# Patient Record
Sex: Female | Born: 1935 | Race: White | Hispanic: No | Smoking: Former smoker
Health system: Southern US, Community
[De-identification: ages and names within clinical notes are randomized; demographics above are authoritative.]

## PROBLEM LIST (undated history)

## (undated) DIAGNOSIS — C801 Malignant (primary) neoplasm, unspecified: Secondary | ICD-10-CM

## (undated) DIAGNOSIS — L920 Granuloma annulare: Secondary | ICD-10-CM

## (undated) DIAGNOSIS — J449 Chronic obstructive pulmonary disease, unspecified: Secondary | ICD-10-CM

## (undated) DIAGNOSIS — R269 Unspecified abnormalities of gait and mobility: Principal | ICD-10-CM

## (undated) DIAGNOSIS — E7800 Pure hypercholesterolemia, unspecified: Secondary | ICD-10-CM

## (undated) DIAGNOSIS — T8859XA Other complications of anesthesia, initial encounter: Secondary | ICD-10-CM

## (undated) DIAGNOSIS — T4145XA Adverse effect of unspecified anesthetic, initial encounter: Secondary | ICD-10-CM

## (undated) DIAGNOSIS — K5790 Diverticulosis of intestine, part unspecified, without perforation or abscess without bleeding: Secondary | ICD-10-CM

## (undated) DIAGNOSIS — E039 Hypothyroidism, unspecified: Secondary | ICD-10-CM

## (undated) DIAGNOSIS — G5620 Lesion of ulnar nerve, unspecified upper limb: Secondary | ICD-10-CM

## (undated) DIAGNOSIS — F419 Anxiety disorder, unspecified: Secondary | ICD-10-CM

## (undated) DIAGNOSIS — I341 Nonrheumatic mitral (valve) prolapse: Secondary | ICD-10-CM

## (undated) DIAGNOSIS — I10 Essential (primary) hypertension: Secondary | ICD-10-CM

## (undated) DIAGNOSIS — I509 Heart failure, unspecified: Secondary | ICD-10-CM

## (undated) HISTORY — PX: TONSILLECTOMY: SUR1361

## (undated) HISTORY — DX: Granuloma annulare: L92.0

## (undated) HISTORY — DX: Unspecified abnormalities of gait and mobility: R26.9

## (undated) HISTORY — DX: Anxiety disorder, unspecified: F41.9

## (undated) HISTORY — PX: APPENDECTOMY: SHX54

## (undated) HISTORY — DX: Pure hypercholesterolemia, unspecified: E78.00

## (undated) HISTORY — PX: CHOLECYSTECTOMY: SHX55

## (undated) HISTORY — PX: MASTECTOMY: SHX3

## (undated) HISTORY — PX: ULNAR NERVE REPAIR: SHX2594

---

## 1998-12-11 ENCOUNTER — Encounter: Payer: Self-pay | Admitting: Emergency Medicine

## 1998-12-12 ENCOUNTER — Inpatient Hospital Stay (HOSPITAL_COMMUNITY): Admission: EM | Admit: 1998-12-12 | Discharge: 1998-12-17 | Payer: Self-pay | Admitting: Emergency Medicine

## 1998-12-12 ENCOUNTER — Encounter: Payer: Self-pay | Admitting: Internal Medicine

## 1998-12-12 ENCOUNTER — Encounter: Payer: Self-pay | Admitting: Emergency Medicine

## 1998-12-14 ENCOUNTER — Encounter: Payer: Self-pay | Admitting: Internal Medicine

## 1998-12-25 ENCOUNTER — Encounter: Admission: RE | Admit: 1998-12-25 | Discharge: 1999-03-25 | Payer: Self-pay | Admitting: Internal Medicine

## 2000-08-26 ENCOUNTER — Encounter: Payer: Self-pay | Admitting: Internal Medicine

## 2000-08-26 ENCOUNTER — Encounter: Admission: RE | Admit: 2000-08-26 | Discharge: 2000-08-26 | Payer: Self-pay | Admitting: Internal Medicine

## 2001-07-22 ENCOUNTER — Encounter: Admission: RE | Admit: 2001-07-22 | Discharge: 2001-07-22 | Payer: Self-pay | Admitting: Internal Medicine

## 2001-07-22 ENCOUNTER — Encounter: Payer: Self-pay | Admitting: Internal Medicine

## 2003-11-04 DIAGNOSIS — K5790 Diverticulosis of intestine, part unspecified, without perforation or abscess without bleeding: Secondary | ICD-10-CM

## 2003-11-04 HISTORY — DX: Diverticulosis of intestine, part unspecified, without perforation or abscess without bleeding: K57.90

## 2003-11-22 ENCOUNTER — Ambulatory Visit (HOSPITAL_COMMUNITY): Admission: RE | Admit: 2003-11-22 | Discharge: 2003-11-22 | Payer: Self-pay | Admitting: Gastroenterology

## 2004-02-21 ENCOUNTER — Encounter: Admission: RE | Admit: 2004-02-21 | Discharge: 2004-02-21 | Payer: Self-pay | Admitting: Internal Medicine

## 2004-04-15 ENCOUNTER — Encounter: Admission: RE | Admit: 2004-04-15 | Discharge: 2004-04-15 | Payer: Self-pay | Admitting: Otolaryngology

## 2005-07-14 ENCOUNTER — Encounter: Admission: RE | Admit: 2005-07-14 | Discharge: 2005-07-14 | Payer: Self-pay | Admitting: Internal Medicine

## 2007-11-09 ENCOUNTER — Encounter: Admission: RE | Admit: 2007-11-09 | Discharge: 2007-11-09 | Payer: Self-pay | Admitting: Internal Medicine

## 2008-12-12 ENCOUNTER — Ambulatory Visit (HOSPITAL_BASED_OUTPATIENT_CLINIC_OR_DEPARTMENT_OTHER): Admission: RE | Admit: 2008-12-12 | Discharge: 2008-12-12 | Payer: Self-pay | Admitting: Orthopedic Surgery

## 2011-02-18 LAB — BASIC METABOLIC PANEL
Chloride: 102 mEq/L (ref 96–112)
GFR calc Af Amer: >60 mL/min (ref >60)
Potassium: 4.5 mEq/L (ref 3.5–5.1)

## 2011-02-18 LAB — POCT HEMOGLOBIN-HEMACUE: Hemoglobin: 12.2 g/dL (ref 12.0–15.0)

## 2011-03-18 NOTE — Op Note (Signed)
[name], [name]              ACCOUNT NO.:  [id number]   MEDICAL RECORD NO.:  [id number]          PATIENT TYPE:  AMB   LOCATION:  DSC                          FACILITY:  [facility]   PHYSICIAN:  Katy Fitch. Sypher, M.D. DATE OF BIRTH:  04/02/1935   DATE OF PROCEDURE:  12/12/2008  DATE OF DISCHARGE:                               OPERATIVE REPORT   PREOPERATIVE DIAGNOSES:  1. Chronic entrapment neuropathy, left median nerve at carpal tunnel.  2. 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   POSTOPERATIVE DIAGNOSES:  1. Chronic entrapment neuropathy, left median nerve at carpal tunnel.  2. 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   OPERATIONS:  1. Decompression of left ulnar nerve at cubital tunnel in situ.  2. Decompression of left median nerve at carpal tunnel.   OPERATING SURGEON:  Katy Fitch. Sypher, MD   ASSISTANT:  Marveen Reeks Dasnoit, PA-C   ANESTHESIA:  General by LMA.   SUPERVISING ANESTHESIOLOGIST:  Burna Forts, MD   INDICATIONS:  Miranda Erickson is a 76 year old woman referred through the  courtesy of Dr. Burton Apley for evaluation and management of arm  discomfort and numbness.  She has a complex past medical history with  diabetes, hypertension, chronic obstructive airway disease, chronic  tobacco abuse, and history of bilateral mastectomy and reconstruction.   She was referred for evaluation and management of arm numbness.  Clinical examination suggested median and ulnar neuropathy on the left.  Electrodiagnostic studies were accomplished prior to referral  documenting significant left ulnar neuropathy across the elbow and  significant left carpal tunnel syndrome.   Due to failure to respond to nonoperative measures, she is now brought  to the operating room at this time for decompression of her left median  nerve at the wrist level and decompression of her left ulnar nerve  across the  elbow.   After informed consent, she was brought to the operating room at this  time.   PROCEDURE:  Miranda Erickson is brought to the operating room and placed  in supine position upon the table.   Following an anesthesia consult by Dr. Jacklynn Bue, general anesthesia by  LMA technique was recommended and accepted.   She was brought to room 6 of the [facility], placed in supine  position upon the operating table, and under Dr. Marlane Mingle supervision,  general anesthesia by LMA technique induced.  The left arm was then  prepped with Betadine soap solution, sterilely draped.  A pneumatic  tourniquet was applied to proximal left brachium.   Following exsanguination of the limb with Esmarch bandage, the arterial  tourniquet was inflated to 170 mmHg and later elevated to 190 mmHg due  to systolic hypertension.  The procedure commenced with a 3-cm incision  directly over the path of the ulnar nerve at the elbow.  Subcutaneous  tissues were carefully divided taking care to gently retract the  posterior branch of the medial antebrachial cutaneous nerve.  The ulnar  nerve was identified by palpation and decompressed from  6 cm above the  elbow to 4 cm distal to the elbow.  There was a very tight constriction  deep to fascial bands beneath the heads of the flexor carpi ulnaris and  a vascular lease compression of the nerve at that level.  The nerve was  stable in the cubital groove.   The wound was inspected for bleeding points, which were  electrocauterized with bipolar current followed by repair of the skin  with subcutaneous suture of 4-0 Vicryl and intradermal 2-0 Prolene with  Steri-Strips.  A compressive dressing was applied with sterile gauze and  a Tegaderm transparent dressing.  Attention was then directed to the  palm.  A 2-cm incision was fashioned in the line of the ring finger.  Subcutaneous tissues were carefully divided.  The palmar fascia was  identified  sublongitudinally to reveal the common sensory branch of the  median nerve and the middle palmar space.  These were followed back to  the median nerve proper, which was gently isolated from the transcarpal  ligament with a Insurance risk surveyor.  The transverse carpal ligament was  released along its ulnar border extending into the distal forearm with  scissors.  This widely opened carpal canal.  The ulnar bursa was noted  be hypertrophic.  Bleeding points were electrocauterized with bipolar  current.  This wound was then repaired with intradermal 2-0 Prolene and  Steri-Strips.   Both wounds were infiltrated with 2% lidocaine postop analgesia.  A  compressive dressing was applied to the hand with volar plaster splint  maintaining the wrist in 5 degrees of dorsiflexion.   Ms. Mccaul tolerated the surgery and anesthesia well.  He was  transferred to recovery room with stable signs.  She will resume her  routine medicines and is provided prescriptions for Vicodin 5 mg 1 p.o.  q.4-6 h. p.r.n. pain, 24 tablets without refill and Keflex 500 mg 1 p.o.  q.8 h. until gone.      Katy Fitch Sypher, M.D.  Electronically Signed     RVS/MEDQ  D:  12/12/2008  T:  12/12/2008  Job:  [id number]

## 2011-03-21 NOTE — Op Note (Signed)
NAME:  Miranda Erickson, Miranda Erickson                        ACCOUNT NO.:  [id number]   MEDICAL RECORD NO.:  [id number]                   PATIENT TYPE:  AMB   LOCATION:  ENDO                                 FACILITY:  [facility]   PHYSICIAN:  John C. Madilyn Fireman, M.D.                 DATE OF BIRTH:  [birth year]   DATE OF PROCEDURE:  11/22/2003  DATE OF DISCHARGE:                                 OPERATIVE REPORT   PROCEDURE PERFORMED:  Colonoscopy.   ENDOSCOPIST:  Barrie Folk, M.D.   INDICATIONS FOR PROCEDURE:  Colon cancer screening in a patient with no  previous screening and personal history of breast cancer.   DESCRIPTION OF PROCEDURE:  The patient was placed in the left lateral  decubitus position and placed on the pulse monitor with continuous low-flow  oxygen delivered by nasal cannula.  The patient was sedated with 100 mcg of  IV fentanyl and 10 mg of IV Versed.  The Olympus video colonoscope was  inserted into the rectum and advanced to its full length, but despite  multiple position changes and abdominal pressure and torquing maneuvers due  to angulation and looping, I was unable to reach the cecum.  It was not  clear.  I felt that the point of farthest visualization was probably  somewhere in the midtransverse colon.  The mucosa there appeared normal as  did the descending colon with no polyps, masses, diverticula or other  mucosal abnormalities.  In the sigmoid colon there were seen several  scattered diverticula and no other abnormalities.  The rectum appeared  normal.  Retroflex view of the anus revealed no obvious internal  hemorrhoids.  The scope was then withdrawn and the patient returned to the  recovery room in stable condition.  She tolerated the procedure well.  There  were no immediate complications.   IMPRESSION:  Diverticulosis, otherwise normal incomplete study estimated to  be to the transverse colon.   PLAN:  Will advise routine follow-up and barium enema.                                John C. Madilyn Fireman, M.D.    JCH/MEDQ  D:  11/22/2003  T:  11/22/2003  Job:  [id number]   cc:   Antony Madura, M.D.  [address], [address]  [place]  [place] [address]  Fax: [phone]

## 2011-11-12 ENCOUNTER — Inpatient Hospital Stay (HOSPITAL_COMMUNITY)
Admission: EM | Admit: 2011-11-12 | Discharge: 2011-11-17 | DRG: 394 | Disposition: A | Discharge status: Home / Self Care | Payer: Medicare Other | Attending: Family Medicine | Admitting: Family Medicine

## 2011-11-12 ENCOUNTER — Encounter (HOSPITAL_COMMUNITY): Payer: Self-pay | Admitting: *Deleted

## 2011-11-12 DIAGNOSIS — I10 Essential (primary) hypertension: Secondary | ICD-10-CM | POA: Diagnosis present

## 2011-11-12 DIAGNOSIS — N179 Acute kidney failure, unspecified: Secondary | ICD-10-CM | POA: Clinically undetermined

## 2011-11-12 DIAGNOSIS — D649 Anemia, unspecified: Secondary | ICD-10-CM

## 2011-11-12 DIAGNOSIS — K529 Noninfective gastroenteritis and colitis, unspecified: Secondary | ICD-10-CM | POA: Diagnosis present

## 2011-11-12 DIAGNOSIS — M109 Gout, unspecified: Secondary | ICD-10-CM

## 2011-11-12 DIAGNOSIS — J449 Chronic obstructive pulmonary disease, unspecified: Secondary | ICD-10-CM | POA: Diagnosis present

## 2011-11-12 DIAGNOSIS — E119 Type 2 diabetes mellitus without complications: Secondary | ICD-10-CM | POA: Diagnosis present

## 2011-11-12 DIAGNOSIS — I959 Hypotension, unspecified: Secondary | ICD-10-CM | POA: Diagnosis present

## 2011-11-12 DIAGNOSIS — K559 Vascular disorder of intestine, unspecified: Principal | ICD-10-CM | POA: Diagnosis present

## 2011-11-12 DIAGNOSIS — R000 Tachycardia, unspecified: Secondary | ICD-10-CM | POA: Diagnosis present

## 2011-11-12 DIAGNOSIS — E785 Hyperlipidemia, unspecified: Secondary | ICD-10-CM | POA: Diagnosis present

## 2011-11-12 DIAGNOSIS — K625 Hemorrhage of anus and rectum: Secondary | ICD-10-CM | POA: Diagnosis present

## 2011-11-12 DIAGNOSIS — Z79899 Other long term (current) drug therapy: Secondary | ICD-10-CM

## 2011-11-12 DIAGNOSIS — K5730 Diverticulosis of large intestine without perforation or abscess without bleeding: Secondary | ICD-10-CM | POA: Diagnosis present

## 2011-11-12 DIAGNOSIS — F17200 Nicotine dependence, unspecified, uncomplicated: Secondary | ICD-10-CM | POA: Diagnosis present

## 2011-11-12 DIAGNOSIS — E039 Hypothyroidism, unspecified: Secondary | ICD-10-CM | POA: Diagnosis present

## 2011-11-12 DIAGNOSIS — Z882 Allergy status to sulfonamides status: Secondary | ICD-10-CM

## 2011-11-12 DIAGNOSIS — Z9010 Acquired absence of unspecified breast and nipple: Secondary | ICD-10-CM

## 2011-11-12 DIAGNOSIS — K589 Irritable bowel syndrome without diarrhea: Secondary | ICD-10-CM | POA: Diagnosis present

## 2011-11-12 DIAGNOSIS — Z853 Personal history of malignant neoplasm of breast: Secondary | ICD-10-CM

## 2011-11-12 DIAGNOSIS — Z886 Allergy status to analgesic agent status: Secondary | ICD-10-CM

## 2011-11-12 DIAGNOSIS — J4489 Other specified chronic obstructive pulmonary disease: Secondary | ICD-10-CM | POA: Diagnosis present

## 2011-11-12 DIAGNOSIS — K922 Gastrointestinal hemorrhage, unspecified: Secondary | ICD-10-CM

## 2011-11-12 DIAGNOSIS — E118 Type 2 diabetes mellitus with unspecified complications: Secondary | ICD-10-CM | POA: Diagnosis present

## 2011-11-12 HISTORY — DX: Diverticulosis of intestine, part unspecified, without perforation or abscess without bleeding: K57.90

## 2011-11-12 HISTORY — DX: Nonrheumatic mitral (valve) prolapse: I34.1

## 2011-11-12 HISTORY — DX: Essential (primary) hypertension: I10

## 2011-11-12 HISTORY — DX: Other complications of anesthesia, initial encounter: T88.59XA

## 2011-11-12 HISTORY — DX: Chronic obstructive pulmonary disease, unspecified: J44.9

## 2011-11-12 HISTORY — DX: Hypothyroidism, unspecified: E03.9

## 2011-11-12 HISTORY — DX: Lesion of ulnar nerve, unspecified upper limb: G56.20

## 2011-11-12 HISTORY — DX: Adverse effect of unspecified anesthetic, initial encounter: T41.45XA

## 2011-11-12 LAB — COMPREHENSIVE METABOLIC PANEL
ALT: 23 U/L (ref 0–35)
AST: 22 U/L (ref 0–37)
Albumin: 4 g/dL (ref 3.5–5.2)
Alkaline Phosphatase: 91 U/L (ref 39–117)
BUN: 27 mg/dL — ABNORMAL HIGH (ref 6–23)
CO2: 30 mEq/L (ref 19–32)
Creatinine, Ser: 1.36 mg/dL — ABNORMAL HIGH (ref 0.50–1.10)
Glucose, Bld: 139 mg/dL — ABNORMAL HIGH (ref 70–99)
Sodium: 135 mEq/L (ref 135–145)
Total Bilirubin: 0.4 mg/dL (ref 0.3–1.2)
Total Protein: 7.9 g/dL (ref 6.0–8.3)

## 2011-11-12 LAB — CBC
MCHC: 35 g/dL (ref 30.0–36.0)
MCV: 92.4 fL (ref 78.0–100.0)
Platelets: 260 10*3/uL (ref 150–400)
RBC: 4.33 MIL/uL (ref 3.87–5.11)

## 2011-11-12 LAB — TYPE AND SCREEN
ABO/RH(D): A POS
Antibody Screen: NEGATIVE

## 2011-11-12 LAB — PROTIME-INR: INR: 1.1 (ref 0.00–1.49)

## 2011-11-12 MED ORDER — SODIUM CHLORIDE 0.9 % IV BOLUS (SEPSIS)
500.0000 mL | Freq: Once | INTRAVENOUS | Status: AC
  Administered 2011-11-12: 500 mL via INTRAVENOUS

## 2011-11-12 MED ORDER — SODIUM CHLORIDE 0.9 % IV SOLN
INTRAVENOUS | Status: DC
  Administered 2011-11-12: 125 mL/h via INTRAVENOUS

## 2011-11-12 NOTE — ED Notes (Signed)
To ed for eval of rectal bleeding since last night. Sent to ed from pmd's office. Pt states her pmd told her it was not from hemorrhoids. C/o lower abd pain.

## 2011-11-12 NOTE — ED Notes (Signed)
Pt. Transferred to CDU report given

## 2011-11-12 NOTE — ED Notes (Signed)
Unable to move pt to CDU at this time. Only one Charity fundraiser.

## 2011-11-12 NOTE — ED Notes (Signed)
Contrast completed. CT notified

## 2011-11-12 NOTE — ED Notes (Signed)
Pt had a BM with bright red blood noted in stool. EDP notified.

## 2011-11-12 NOTE — ED Provider Notes (Signed)
History     CSN: [id number]  Arrival date & time 11/12/11  1322   First MD Initiated Contact with Patient 11/12/11 1525      Chief Complaint  Patient presents with  . Rectal Bleeding    (Consider location/radiation/quality/duration/timing/severity/associated sxs/prior treatment) Patient is a 77 y.o. female presenting with hematochezia. The history is provided by the patient and a relative.  Rectal Bleeding  The current episode started today. The problem has been gradually improving. The pain is mild. The stool is described as soft. There was no prior successful therapy. There was no prior unsuccessful therapy. Associated symptoms include abdominal pain. Pertinent negatives include no anorexia, no fever, no hemorrhoids, no nausea, no rectal pain and no hematuria. She has been behaving normally. She has been eating and drinking normally. Her past medical history does not include inflammatory bowel disease or recent abdominal injury. There were no sick contacts. She has received no recent medical care.    Past Medical History  Diagnosis Date  . Diverticulosis 11/2003    Seen on colonoscopy  . Diabetes mellitus   . Hypertension   . COPD (chronic obstructive pulmonary disease)   . Breast cancer 1979    S/p bilateral mastectomy and reconstruction  . 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 Mitral valve prolapse   . Hypothyroidism   . Complication of anesthesia     demerol    Past Surgical History  Procedure Date  . Ulnar nerve repair     Decompression of ulnar and median nerve at carpal tunnel   . Mastectomy     bilateral, s/p reconstruction    Family History  Problem Relation Age of Onset  . Breast cancer Mother     Deceased at 81  . Parkinsonism Father     History  Substance Use Topics  . Smoking status: Current Everyday Smoker -- 1.5 packs/day for 15 years    Types: Cigarettes  . Smokeless tobacco: Never Used  . Alcohol  Use: No    OB History    Grav Para Term Preterm Abortions TAB SAB Ect Mult Living                  Review of Systems  Constitutional: Negative for fever.  Gastrointestinal: Positive for abdominal pain and hematochezia. Negative for nausea, rectal pain, anorexia and hemorrhoids.  Genitourinary: Negative for hematuria.  All other systems reviewed and are negative.    Allergies  Demerol and Sulfa antibiotics  Home Medications   No current outpatient prescriptions on file.  BP 111/53  Pulse 102  Temp(Src) 98.5 F (36.9 C) (Oral)  Resp 18  Ht 5\' 2"  (1.575 m)  Wt 177 lb 11.1 oz (80.6 kg)  BMI 32.50 kg/m2  SpO2 93%  Physical Exam  Nursing note and vitals reviewed. Constitutional: She is oriented to person, place, and time. She appears well-developed and well-nourished.  HENT:  Head: Normocephalic and atraumatic.  Eyes: Conjunctivae and EOM are normal. Pupils are equal, round, and reactive to light.  Neck: Normal range of motion and phonation normal. Neck supple.  Cardiovascular: Normal rate, regular rhythm and intact distal pulses.   Pulmonary/Chest: Effort normal and breath sounds normal. She exhibits no tenderness.  Abdominal: Soft. She exhibits no distension. There is no tenderness. There is no guarding.  Genitourinary:       Right red blood in the rectum. No stool in the rectum. No clots on  digital exam. There are no anal lesions. No palpable internal or external hemorrhoids.  Musculoskeletal: Normal range of motion.  Neurological: She is alert and oriented to person, place, and time. She has normal strength and normal reflexes. She exhibits normal muscle tone.  Skin: Skin is warm and dry.  Psychiatric: She has a normal mood and affect. Her behavior is normal. Judgment and thought content normal.    ED Course  Procedures (including critical care time)  Labs Reviewed  CBC - Abnormal; Notable for the following:    WBC 11.2 (*)    All other components within normal  limits  COMPREHENSIVE METABOLIC PANEL - Abnormal; Notable for the following:    Chloride 92 (*)    Glucose, Bld 139 (*)    BUN 27 (*)    Creatinine, Ser 1.36 (*)    GFR calc non Af Amer 37 (*)    GFR calc Af Amer 43 (*)    All other components within normal limits  CBC - Abnormal; Notable for the following:    RBC 3.84 (*)    Hemoglobin 11.9 (*)    HCT 35.4 (*)    All other components within normal limits  BASIC METABOLIC PANEL - Abnormal; Notable for the following:    Glucose, Bld 155 (*)    Calcium 8.3 (*)    GFR calc non Af Amer 53 (*)    GFR calc Af Amer 61 (*)    All other components within normal limits  GLUCOSE, CAPILLARY - Abnormal; Notable for the following:    Glucose-Capillary 186 (*)    All other components within normal limits  GLUCOSE, CAPILLARY - Abnormal; Notable for the following:    Glucose-Capillary 176 (*)    All other components within normal limits  GLUCOSE, CAPILLARY - Abnormal; Notable for the following:    Glucose-Capillary 136 (*)    All other components within normal limits  PROTIME-INR  TYPE AND SCREEN  ABO/RH  LACTIC ACID, PLASMA  STOOL CULTURE  POCT CBG MONITORING  CLOSTRIDIUM DIFFICILE CULTURE-FECAL  POCT CBG MONITORING   Ct Abdomen Pelvis W Contrast  11/13/2011  *RADIOLOGY REPORT*  Clinical Data: Rectal bleeding.  Lower abdominal pain.  CT ABDOMEN AND PELVIS WITH CONTRAST  Technique:  Multidetector CT imaging of the abdomen and pelvis was performed following the standard protocol during bolus administration of intravenous contrast.  Contrast: OMNIPAQUE IOHEXOL 300 MG/ML IV SOLN, 40mL OMNIPAQUE IOHEXOL 300 MG/ML IV SOLN  Comparison: None.  Findings: Lung bases appear clear.  Liver demonstrates old granulomatous calcification adjacent to the falciform ligament. Cholecystectomy.  No calcified gallstones.  Atrophy of the pancreas.  Normal renal enhancement and excretion of contrast. Nonobstructing right renal collecting system calculus in the  inferior pole measuring 7 mm.  Abdominal aortic atherosclerosis. The adrenal glands appear normal.  Stomach and proximal small bowel appear normal. Prominent stool burden is present in the right lower quadrant.  The appendix is not identified.  Prominent stool is present in the cecum. There are marked colonic inflammatory changes extending from the distal transverse colon along the descending colon.  There is no perforation.  The inflammatory changes extend through the sigmoid nearly to the rectum.  Uterus and adnexa appear normal.  Urinary bladder normal. Aortoiliac atherosclerosis.  No aggressive osseous lesions.  Large vertebral body hemangioma at T12.  IMPRESSION: 1.  Colitis extending from the distal transverse colon through the sigmoid.  Differential considerations include inflammatory bowel disease, infectious colitis including C difficile, with ischemia  felt unlikely based on distribution.  The visualized visceral vessels demonstrate opacification of contrast. 2.  Cholecystectomy. 3.  7 mm nonobstructing right inferior pole renal collecting system calculus.  Original Report Authenticated By: Andreas Newport, M.D.   16:53- 1 moved to the holding area to await the CT scan. IV fluid bolus ordered for blood pressure 96/52.  1. GI bleed   2. Colitis   3. Anemia   4. Diabetes mellitus   5. COPD (chronic obstructive pulmonary disease)   6. Hypertension   7. Hypothyroidism       MDM    Rectal bleeding with mild indistinct abdominal pain. CT scan ordered to rule out diverticular disease, and colitis.     Flint Melter, MD 11/13/11 [phone]

## 2011-11-13 ENCOUNTER — Encounter (HOSPITAL_COMMUNITY): Payer: Self-pay | Admitting: Radiology

## 2011-11-13 ENCOUNTER — Emergency Department (HOSPITAL_COMMUNITY): Payer: Medicare Other

## 2011-11-13 DIAGNOSIS — K529 Noninfective gastroenteritis and colitis, unspecified: Secondary | ICD-10-CM | POA: Diagnosis present

## 2011-11-13 DIAGNOSIS — K625 Hemorrhage of anus and rectum: Secondary | ICD-10-CM | POA: Diagnosis present

## 2011-11-13 DIAGNOSIS — I10 Essential (primary) hypertension: Secondary | ICD-10-CM | POA: Diagnosis present

## 2011-11-13 DIAGNOSIS — N179 Acute kidney failure, unspecified: Secondary | ICD-10-CM | POA: Clinically undetermined

## 2011-11-13 DIAGNOSIS — E039 Hypothyroidism, unspecified: Secondary | ICD-10-CM | POA: Diagnosis present

## 2011-11-13 DIAGNOSIS — I959 Hypotension, unspecified: Secondary | ICD-10-CM | POA: Diagnosis present

## 2011-11-13 DIAGNOSIS — E118 Type 2 diabetes mellitus with unspecified complications: Secondary | ICD-10-CM | POA: Diagnosis present

## 2011-11-13 DIAGNOSIS — J449 Chronic obstructive pulmonary disease, unspecified: Secondary | ICD-10-CM | POA: Diagnosis present

## 2011-11-13 LAB — BASIC METABOLIC PANEL
CO2: 27 mEq/L (ref 19–32)
Chloride: 100 mEq/L (ref 96–112)
Creatinine, Ser: 1.02 mg/dL (ref 0.50–1.10)
GFR calc Af Amer: 61 mL/min — ABNORMAL LOW (ref >90)
Potassium: 3.5 mEq/L (ref 3.5–5.1)

## 2011-11-13 LAB — STOOL CULTURE

## 2011-11-13 LAB — CBC
Hemoglobin: 11.9 g/dL — ABNORMAL LOW (ref 12.0–15.0)
MCHC: 33.6 g/dL (ref 30.0–36.0)
WBC: 9.3 10*3/uL (ref 4.0–10.5)

## 2011-11-13 LAB — GLUCOSE, CAPILLARY
Glucose-Capillary: 186 mg/dL — ABNORMAL HIGH (ref 70–99)
Glucose-Capillary: 136 mg/dL — ABNORMAL HIGH (ref 70–99)
Glucose-Capillary: 120 mg/dL — ABNORMAL HIGH (ref 70–99)
Glucose-Capillary: 105 mg/dL — ABNORMAL HIGH (ref 70–99)

## 2011-11-13 LAB — LACTIC ACID, PLASMA: Lactic Acid, Venous: 1.4 mmol/L (ref 0.5–2.2)

## 2011-11-13 MED ORDER — METRONIDAZOLE IN NACL 5-0.79 MG/ML-% IV SOLN
500.0000 mg | Freq: Once | INTRAVENOUS | Status: AC
  Administered 2011-11-13: 500 mg via INTRAVENOUS
  Filled 2011-11-13: qty 100

## 2011-11-13 MED ORDER — INSULIN ASPART 100 UNIT/ML ~~LOC~~ SOLN
0.0000 [IU] | Freq: Three times a day (TID) | SUBCUTANEOUS | Status: DC
  Administered 2011-11-13: 2 [IU] via SUBCUTANEOUS
  Administered 2011-11-13: 3 [IU] via SUBCUTANEOUS
  Administered 2011-11-14 – 2011-11-15 (×4): 2 [IU] via SUBCUTANEOUS
  Administered 2011-11-15 – 2011-11-17 (×6): 5 [IU] via SUBCUTANEOUS
  Filled 2011-11-13: qty 3

## 2011-11-13 MED ORDER — IPRATROPIUM BROMIDE 0.02 % IN SOLN
0.5000 mg | Freq: Four times a day (QID) | RESPIRATORY_TRACT | Status: DC
  Administered 2011-11-13: 0.5 mg via RESPIRATORY_TRACT
  Filled 2011-11-13: qty 2.5

## 2011-11-13 MED ORDER — SODIUM CHLORIDE 0.9 % IV SOLN
INTRAVENOUS | Status: DC
  Administered 2011-11-13 (×2): via INTRAVENOUS

## 2011-11-13 MED ORDER — ALBUTEROL SULFATE (5 MG/ML) 0.5% IN NEBU: 2.5000 mg | INHALATION_SOLUTION | RESPIRATORY_TRACT | Status: DC | PRN

## 2011-11-13 MED ORDER — IOHEXOL 300 MG/ML  SOLN
40.0000 mL | Freq: Once | INTRAMUSCULAR | Status: AC | PRN
  Administered 2011-11-13: 40 mL via ORAL

## 2011-11-13 MED ORDER — ACETAMINOPHEN 650 MG RE SUPP: 650.0000 mg | Freq: Four times a day (QID) | RECTAL | Status: DC | PRN

## 2011-11-13 MED ORDER — OXYCODONE HCL 5 MG PO TABS
5.0000 mg | ORAL_TABLET | ORAL | Status: DC | PRN
  Administered 2011-11-13 – 2011-11-16 (×9): 5 mg via ORAL
  Filled 2011-11-13 (×10): qty 1

## 2011-11-13 MED ORDER — PEG 3350-KCL-NA BICARB-NACL 420 G PO SOLR
4000.0000 mL | Freq: Once | ORAL | Status: AC
  Administered 2011-11-13: 4000 mL via ORAL
  Filled 2011-11-13: qty 4000

## 2011-11-13 MED ORDER — SODIUM CHLORIDE 0.9 % IV BOLUS (SEPSIS)
1000.0000 mL | Freq: Once | INTRAVENOUS | Status: AC
  Administered 2011-11-13: 1000 mL via INTRAVENOUS

## 2011-11-13 MED ORDER — CIPROFLOXACIN IN D5W 400 MG/200ML IV SOLN
400.0000 mg | Freq: Once | INTRAVENOUS | Status: AC
  Administered 2011-11-13: 400 mg via INTRAVENOUS
  Filled 2011-11-13: qty 200

## 2011-11-13 MED ORDER — METRONIDAZOLE IN NACL 5-0.79 MG/ML-% IV SOLN
500.0000 mg | Freq: Three times a day (TID) | INTRAVENOUS | Status: DC
  Administered 2011-11-13 – 2011-11-17 (×13): 500 mg via INTRAVENOUS
  Filled 2011-11-13 (×16): qty 100

## 2011-11-13 MED ORDER — IOHEXOL 300 MG/ML  SOLN
100.0000 mL | Freq: Once | INTRAMUSCULAR | Status: AC | PRN
  Administered 2011-11-13: 100 mL via INTRAVENOUS

## 2011-11-13 MED ORDER — LEVOTHYROXINE SODIUM 50 MCG PO TABS
50.0000 ug | ORAL_TABLET | ORAL | Status: DC
  Administered 2011-11-14 – 2011-11-17 (×4): 50 ug via ORAL
  Filled 2011-11-13 (×6): qty 1

## 2011-11-13 MED ORDER — ONDANSETRON HCL 4 MG PO TABS: 4.0000 mg | ORAL_TABLET | Freq: Four times a day (QID) | ORAL | Status: DC | PRN

## 2011-11-13 MED ORDER — ALBUTEROL SULFATE (5 MG/ML) 0.5% IN NEBU
2.5000 mg | INHALATION_SOLUTION | Freq: Four times a day (QID) | RESPIRATORY_TRACT | Status: DC
  Administered 2011-11-13: 2.5 mg via RESPIRATORY_TRACT
  Filled 2011-11-13: qty 0.5

## 2011-11-13 MED ORDER — ONDANSETRON HCL 4 MG/2ML IJ SOLN: 4.0000 mg | Freq: Four times a day (QID) | INTRAMUSCULAR | Status: DC | PRN

## 2011-11-13 MED ORDER — ALBUTEROL SULFATE HFA 108 (90 BASE) MCG/ACT IN AERS
2.0000 | INHALATION_SPRAY | Freq: Four times a day (QID) | RESPIRATORY_TRACT | Status: DC | PRN
  Filled 2011-11-13: qty 6.7

## 2011-11-13 MED ORDER — MORPHINE SULFATE 4 MG/ML IJ SOLN
4.0000 mg | Freq: Once | INTRAMUSCULAR | Status: AC
  Administered 2011-11-13: 4 mg via INTRAVENOUS
  Filled 2011-11-13: qty 1

## 2011-11-13 MED ORDER — SODIUM CHLORIDE 0.9 % IV SOLN
INTRAVENOUS | Status: DC
  Administered 2011-11-13: 04:00:00 via INTRAVENOUS

## 2011-11-13 MED ORDER — SIMVASTATIN 40 MG PO TABS
40.0000 mg | ORAL_TABLET | Freq: Every day | ORAL | Status: DC
  Administered 2011-11-13 – 2011-11-16 (×4): 40 mg via ORAL
  Filled 2011-11-13 (×6): qty 1

## 2011-11-13 MED ORDER — ACETAMINOPHEN 325 MG PO TABS: 650.0000 mg | ORAL_TABLET | Freq: Four times a day (QID) | ORAL | Status: DC | PRN

## 2011-11-13 MED ORDER — AMITRIPTYLINE HCL 100 MG PO TABS
100.0000 mg | ORAL_TABLET | Freq: Every day | ORAL | Status: DC
  Administered 2011-11-13 – 2011-11-16 (×4): 100 mg via ORAL
  Filled 2011-11-13 (×6): qty 1

## 2011-11-13 MED ORDER — ALBUTEROL SULFATE (5 MG/ML) 0.5% IN NEBU: 2.5000 mg | INHALATION_SOLUTION | Freq: Four times a day (QID) | RESPIRATORY_TRACT | Status: DC | PRN

## 2011-11-13 NOTE — H&P (Signed)
PCP:   Lorenda Peck, MD, MD   Confirmed with pt   Chief Complaint:  BRBPR  HPI: 77yoF with h/o remote breast ca, MV prolapse, DM/insulin, likely COPD presents with  BRBPR and found to have colitis, mild hypoTN/tachycardia, and ARF.   Pt is good historian and states that she woke up Wednesday am, went to have a BM and it  was pure bright red blood, not maroon or melena. It was "a lot," and after this she sat  down to have a cup of coffee, thinking about what to do. She had her daughter take her  to PCP's office, where he did a rectal exam that reportedly did not show any  hemorrhoids. She was referred to the ED.   Of note, she also indicates that on top of a background of occasional IBD type pain,  she has had "bad" BLQ abdominal pain for the past 2 weeks, that has been so severe that  she was unable to walk. She has been fatigued as well. Finally, she notes that today at  [facility] office, while being weighed, she has lost about 25 lbs since last September. She  denies any fevers, chills, sweats, nausea, vomiting.   In the ED pt was minimally hypotense to 96/52, pulses 80-100's. Labs significant for  renal 27/1.36, normal LFT's, WBC 11.2, Hct 40.0 down to 35.4 in the ED, plts normal.  INR 1.1. CTAP with contrast showed colitis from distal transverse colon through the  sigmoid, with DDx including IBD, infectious colitis; ischemic felt less likely based on  distribution, and visualized visceral vessels showing opacification of contrast. Pt was  given NS 1L, morphine, and cipro/flagyl ordered.   ROS as above, o/w negative for chest pain, SOB, dysuria. ROS o/w negative.   Past Medical History  Diagnosis Date  . Diverticulosis 11/2003    Seen on colonoscopy  . Diabetes mellitus   . Hypertension   . COPD (chronic obstructive pulmonary disease)   . Breast cancer 1979    S/p bilateral mastectomy and reconstruction  . 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 Mitral valve prolapse   . Hypothyroidism     Past Surgical History  Procedure Date  . Ulnar nerve repair     Decompression of ulnar and median nerve at carpal tunnel   . Mastectomy     bilateral, s/p reconstruction   Medications:  HOME MEDS:  Reconciled with pt  Prior to Admission medications   Medication Sig Start Date End Date Taking? Authorizing Provider  albuterol (PROVENTIL HFA;VENTOLIN HFA) 108 (90 BASE) MCG/ACT inhaler Inhale 2 puffs into the lungs every 6 (six) hours as needed. Shortness of breath   Yes Historical Provider, MD  amitriptyline (ELAVIL) 100 MG tablet Take 100 mg by mouth at bedtime.   Yes Historical Provider, MD  atenolol (TENORMIN) 100 MG tablet Take 100 mg by mouth daily.   Yes Historical Provider, MD  atorvastatin (LIPITOR) 20 MG tablet Take 20 mg by mouth daily.   Yes Historical Provider, MD  calcium carbonate (OS-CAL) 600 MG TABS Take 600 mg by mouth daily.   Yes Historical Provider, MD  chlorthalidone (HYGROTON) 25 MG tablet Take 25 mg by mouth daily.   Yes Historical Provider, MD  diphenhydrAMINE (SOMINEX) 25 MG tablet Take 25 mg by mouth at bedtime as needed. For allergies   Yes Historical Provider, MD  glimepiride (AMARYL) 4 MG tablet Take 4 mg  by mouth daily before breakfast.   Yes Historical Provider, MD  ibuprofen (ADVIL,MOTRIN) 200 MG tablet Take 600 mg by mouth every 6 (six) hours as needed. For fever/pain   Yes Historical Provider, MD  insulin NPH-insulin regular (NOVOLIN 70/30) (70-30) 100 UNIT/ML injection Inject 45-55 Units into the skin 2 (two) times daily with a meal. Take 55 units in the morning and 45 units in the evening   Yes Historical Provider, MD  levothyroxine (SYNTHROID, LEVOTHROID) 50 MCG tablet Take 50 mcg by mouth daily.   Yes Historical Provider, MD  lisinopril (PRINIVIL,ZESTRIL) 10 MG tablet Take 10 mg by mouth daily.   Yes Historical Provider, MD  metFORMIN (GLUCOPHAGE) 1000 MG tablet Take 1,000  mg by mouth 2 (two) times daily with a meal.   Yes Historical Provider, MD  naproxen sodium (ANAPROX) 220 MG tablet Take 660 mg by mouth 2 (two) times daily as needed. For pain   Yes Historical Provider, MD  phenobarbital-belladonna alkaloids (DONNATAL EXTENTABS) 48 MG CR tablet Take 1 tablet by mouth every 6 (six) hours as needed. For pain   Yes Historical Provider, MD    Allergies:  Allergies  Allergen Reactions  . Demerol Nausea And Vomiting  . Sulfa Antibiotics Hives    Social History:   reports that she has been smoking Cigarettes.  She has a 50 pack-year smoking history. She has never used smokeless tobacco. She reports that she does not drink alcohol or use illicit drugs. Lives at home alone, ambulatory but uses cane/walker. Still independent. Has 3 kids, oldest is recovering from bladder. Current smoker 1ppd, smoked 50 yrs. Drinks no alcohol, no drugs.   Family History: Family History  Problem Relation Age of Onset  . Breast cancer Mother     Deceased at 31  . Parkinsonism Father     Physical Exam: Filed Vitals:   11/13/11 0100 11/13/11 0200 11/13/11 0230 11/13/11 0244  BP: 94/46 98/52 105/51 101/44  Pulse: 99 103  101  Temp:    98.2 F (36.8 C)  TempSrc:    Oral  Resp: 28 17  24   SpO2: 96% 95%  24%   Blood pressure 101/44, pulse 101, temperature 98.2 F (36.8 C), temperature source Oral, resp. rate 24, SpO2 24.00%.  Gen: 77yo F in ED stretcher, appears well, non-toxic, can relate her history well,  no distress HEENT: PERRL, EOMI, sclera clear without icterus, mouth and tongue fairly dry appearing  but overall normal Lungs: Inspiratory and expiratory diffuse wheezes noted throughout, fair air movement  only  Heart: Difficult to hear S1/2  Abd: Obese, moderately distended but not tight, no subjective TTP or facial grimacing  throughout, hypoactive BS's  Extrem: Good bulk and normal tone, warm but not hot, perfusing normally, bilateral  radials easily palpable,  BLE's without edema  Neuro: Alert, pleasant, conversant, CN2-12 intact, no slurred speech or facial droop,  moving extremities, sits up in bed with minimal assistance, grossly non-focal    Labs & Imaging Results for orders placed during the hospital encounter of 11/12/11 (from the past 48 hour(s))  CBC     Status: Abnormal   Collection Time   11/12/11  2:26 PM      Component Value Range Comment   WBC 11.2 (*) 4.0 - 10.5 (K/uL)    RBC 4.33  3.87 - 5.11 (MIL/uL)    Hemoglobin 14.0  12.0 - 15.0 (g/dL)    HCT 29.5  62.1 - 30.8 (%)    MCV 92.4  78.0 -  100.0 (fL)    MCH 32.3  26.0 - 34.0 (pg)    MCHC 35.0  30.0 - 36.0 (g/dL)    RDW 65.7  84.6 - 96.2 (%)    Platelets 260  150 - 400 (K/uL)   PROTIME-INR     Status: Normal   Collection Time   11/12/11  2:26 PM      Component Value Range Comment   Prothrombin Time 14.4  11.6 - 15.2 (seconds)    INR 1.10  0.00 - 1.49    COMPREHENSIVE METABOLIC PANEL     Status: Abnormal   Collection Time   11/12/11  2:26 PM      Component Value Range Comment   Sodium 135  135 - 145 (mEq/L)    Potassium 4.5  3.5 - 5.1 (mEq/L)    Chloride 92 (*) 96 - 112 (mEq/L)    CO2 30  19 - 32 (mEq/L)    Glucose, Bld 139 (*) 70 - 99 (mg/dL)    BUN 27 (*) 6 - 23 (mg/dL)    Creatinine, Ser 9.52 (*) 0.50 - 1.10 (mg/dL)    Calcium 84.1  8.4 - 10.5 (mg/dL)    Total Protein 7.9  6.0 - 8.3 (g/dL)    Albumin 4.0  3.5 - 5.2 (g/dL)    AST 22  0 - 37 (U/L)    ALT 23  0 - 35 (U/L)    Alkaline Phosphatase 91  39 - 117 (U/L)    Total Bilirubin 0.4  0.3 - 1.2 (mg/dL)    GFR calc non Af Amer 37 (*) >90 (mL/min)    GFR calc Af Amer 43 (*) >90 (mL/min)   TYPE AND SCREEN     Status: Normal   Collection Time   11/12/11  2:32 PM      Component Value Range Comment   ABO/RH(D) A POS      Antibody Screen NEG      Sample Expiration 11/15/2011     ABO/RH     Status: Normal   Collection Time   11/12/11  2:32 PM      Component Value Range Comment   ABO/RH(D) A POS     CBC     Status:  Abnormal   Collection Time   11/13/11 12:32 AM      Component Value Range Comment   WBC 9.3  4.0 - 10.5 (K/uL)    RBC 3.84 (*) 3.87 - 5.11 (MIL/uL)    Hemoglobin 11.9 (*) 12.0 - 15.0 (g/dL)    HCT 32.4 (*) 40.1 - 46.0 (%)    MCV 92.2  78.0 - 100.0 (fL)    MCH 31.0  26.0 - 34.0 (pg)    MCHC 33.6  30.0 - 36.0 (g/dL)    RDW 02.7  25.3 - 66.4 (%)    Platelets 214  150 - 400 (K/uL)    Ct Abdomen Pelvis W Contrast  11/13/2011  *RADIOLOGY REPORT*  Clinical Data: Rectal bleeding.  Lower abdominal pain.  CT ABDOMEN AND PELVIS WITH CONTRAST  Technique:  Multidetector CT imaging of the abdomen and pelvis was performed following the standard protocol during bolus administration of intravenous contrast.  Contrast: OMNIPAQUE IOHEXOL 300 MG/ML IV SOLN, 40mL OMNIPAQUE IOHEXOL 300 MG/ML IV SOLN  Comparison: None.  Findings: Lung bases appear clear.  Liver demonstrates old granulomatous calcification adjacent to the falciform ligament. Cholecystectomy.  No calcified gallstones.  Atrophy of the pancreas.  Normal renal enhancement and excretion of contrast. Nonobstructing right  renal collecting system calculus in the inferior pole measuring 7 mm.  Abdominal aortic atherosclerosis. The adrenal glands appear normal.  Stomach and proximal small bowel appear normal. Prominent stool burden is present in the right lower quadrant.  The appendix is not identified.  Prominent stool is present in the cecum. There are marked colonic inflammatory changes extending from the distal transverse colon along the descending colon.  There is no perforation.  The inflammatory changes extend through the sigmoid nearly to the rectum.  Uterus and adnexa appear normal.  Urinary bladder normal. Aortoiliac atherosclerosis.  No aggressive osseous lesions.  Large vertebral body hemangioma at T12.  IMPRESSION: 1.  Colitis extending from the distal transverse colon through the sigmoid.  Differential considerations include inflammatory bowel  disease, infectious colitis including C difficile, with ischemia felt unlikely based on distribution.  The visualized visceral vessels demonstrate opacification of contrast. 2.  Cholecystectomy. 3.  7 mm nonobstructing right inferior pole renal collecting system calculus.  Original Report Authenticated By: Andreas Newport, M.D.    Impression Present on Admission:  .Diabetes mellitus .COPD (chronic obstructive pulmonary disease) .Hypothyroidism .Hypertension .Colitis .Bright red blood per rectum .Hypotension  76yoF with h/o remote breast ca, MV prolapse, DM/insulin, likely COPD presents with  BRBPR and found to have colitis, mild hypoTN/tachycardia, and ARF.   1. Colitis, BRBPR: Infectious vs IBD. Given pt's report of fatigue, several weeks of  abdominal pain, and 25 lb weight loss, IBD is not unreasonable consideration (Crohn's has  bimodal distribution and with second peak from 81-70 yo). Hct 40 on  admission, decreased to 35 but may have been dilutional with IVF's. Will monitor  closely and hold on transfusion just yet.   Of note, last colonoscpy 2005 showed diverticulosis only, although was limited exam to  midtransverse colon only.   - Recommend GI consult in the am for potential colonoscopy.  - Continue cipro/flagyl, keep NPO can advance to liquid as tolerated, get stool  cultures, Cdiff, lactate   2. Hypotension: Mild, also with minimal tachycardia. Pt now s/p 1L NS, getting 2nd L. She  is mentating well and looks well, do not feel she is in full on shock but will watch  closely  - Admit telemetry, do not feel stepdown needed at this point  - Bolus 2nd L then maintenance IVF's. Holding home Atenolol, chlorthalidone, lisinopril  3. Presumed ARF: renal 27/1.36 up from Cr 0.89 back in 2010. Presumed acute, will give  IVF's and trend BMET.   4. COPD: Records indicate this Dx but pt not clear. Has 50 pack yr history, and lung exam  consistent with COPD. Scheduled nebs while  admitted.   5. Diabetes: Holding glimeprimide, metformin, home 70/30 regimen while NPO, and give SSI  6. Hypothyroid: continue home thyroxine  7. HL: continue statin  8. Continue home Amitriptyline   Telemetry bed, MC team 6 DNR/DNI -- discussed with pt   Other plans as per orders.  Kaliann Coryell 11/13/2011, 3:01 AM

## 2011-11-13 NOTE — ED Notes (Signed)
Patient up to use the BSC  Moderate amount of urine with blood mixed in with it

## 2011-11-13 NOTE — Progress Notes (Signed)
11/13/2011 Radek Carnero [facility] Case Management Note [phone]  Utilization review completed.  

## 2011-11-13 NOTE — Progress Notes (Signed)
INITIAL ADULT NUTRITION ASSESSMENT Date: 11/13/2011   Time: 3:15 PM  Reason for Assessment: Nutrition Risk Report (Unintentional Wt Loss)  ASSESSMENT: Female 77 y.o.  Dx: Colitis  Hx:  Past Medical History  Diagnosis Date  . Diverticulosis 11/2003    Seen on colonoscopy  . Diabetes mellitus   . Hypertension   . COPD (chronic obstructive pulmonary disease)   . Breast cancer 1979    S/p bilateral mastectomy and reconstruction  . 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 Mitral valve prolapse   . Hypothyroidism   . Complication of anesthesia     demerol   Related Meds:     . amitriptyline  100 mg Oral QHS  . ciprofloxacin  400 mg Intravenous Once  . insulin aspart  0-15 Units Subcutaneous TID WC  . levothyroxine  50 mcg Oral Q0700  . metronidazole  500 mg Intravenous Once  . metronidazole  500 mg Intravenous Q8H  .  morphine injection  4 mg Intravenous Once  . polyethylene glycol-electrolytes  4,000 mL Oral Once  . simvastatin  40 mg Oral q1800  . sodium chloride  1,000 mL Intravenous Once  . sodium chloride  500 mL Intravenous Once  . sodium chloride  500 mL Intravenous Once  . DISCONTD: sodium chloride   Intravenous STAT  . DISCONTD: albuterol  2.5 mg Nebulization Q6H  . DISCONTD: ipratropium  0.5 mg Nebulization Q6H   Ht: 5\' 2"  (157.5 cm)  Wt: 177 lb 11.1 oz (80.6 kg)  Ideal Wt: 50 kg % Ideal Wt: 161%  Usual Wt: 205 lb (93.2 kg) in September 2012 % Usual Wt: 86%  Body mass index is 32.50 kg/(m^2). Pt is obese.  Food/Nutrition Related Hx: Regular diet PTA  Labs:  CMP     Component Value Date/Time   NA 135 11/13/2011 0910   K 3.5 11/13/2011 0910   CL 100 11/13/2011 0910   CO2 27 11/13/2011 0910   GLUCOSE 155* 11/13/2011 0910   BUN 16 11/13/2011 0910   CREATININE 1.02 11/13/2011 0910   CALCIUM 8.3* 11/13/2011 0910   PROT 7.9 11/12/2011 1426   ALBUMIN 4.0 11/12/2011 1426   AST 22 11/12/2011 1426   ALT 23 11/12/2011  1426   ALKPHOS 91 11/12/2011 1426   BILITOT 0.4 11/12/2011 1426   GFRNONAA 53* 11/13/2011 0910   GFRAA 61* 11/13/2011 0910   CBG (last 3)   Basename 11/13/11 1207 11/13/11 0814 11/13/11 0338  GLUCAP 136* 176* 186*   No results found for this basename: HGBA1C   Intake/Output: I/O last 3 completed shifts: In: 310.4 [I.V.:310.4] Out: -     Diet Order: NPO  Supplements/Tube Feeding: none  IVF:    sodium chloride Last Rate: 125 mL/hr at 11/13/11 0402  DISCONTD: sodium chloride Last Rate: 125 mL/hr (11/12/11 1954)   Estimated Nutritional Needs:   Kcal: 1500 - 1700 Protein: 70 - 85 g Fluid:  1.5 - 1.7 L/d  Pt reports ~25# wt loss since September 2012, a weight change of 14% x 4 months. Wt loss from unknown etiology. Pt denies any change in PO intake or physical activity. Pt with hx of IBS. Plan for colonoscopy tomorrow. Pt eats Regular diet PTA.  NUTRITION DIAGNOSIS: Unintentional wt loss  RELATED TO: unknown etiology  AS EVIDENCE BY: pt report of 14% wt loss x 4 months.  MONITORING/EVALUATION(Goals): Goal: Pt to advance to diet as tolerated. Monitor: weights, labs, diet  advancement, PO intake  EDUCATION NEEDS: -Education not appropriate at this time  INTERVENTION: 1. Advance diet per MD, as able. 2. RD to follow nutrition care plan.  Dietitian #: [phone]  DOCUMENTATION CODES Per approved criteria  -Obesity Unspecified    Adair Laundry 11/13/2011, 3:15 PM

## 2011-11-13 NOTE — Progress Notes (Signed)
Pt arrived to the floor via stretcher accompanied by ED staff. Transferred to the bed and admission assessment and history completed. Bed lowered to lowest position, wheels locked, and bed alarm turned on. Pt has no complaints of pain or shortness of breath. Will continue to assess pt periodically.   

## 2011-11-13 NOTE — ED Provider Notes (Signed)
Pt presented to ED w/ c/o rectal bleeding and diffuse lower abd pain, worst in LLQ since early yesterday morning.  Moved to CDU to await CT abd/pelvis and repeat hgb.  Pt continues to have abd pain but declines pain medication at this time.  She continues to have active bleeding from rectum. Hgb has dropped from 14.0 to 11.9 and CT shows colitis extending from distal transverse colon through sigmoid.  Second IV, fluid bolus, cipro and flagyl ordered.  [facility] consulted for admission.    Otilio Miu, [place] 11/13/11 1818

## 2011-11-13 NOTE — Consult Note (Signed)
[facility] CONSULT Reason for consult: GI bleeding and abdominal pain Referring Physician: [facility] primary care physician Dr. Zenaida Deed  Miranda Erickson is an 77 y.o. female.  HPI: The patient has a long history of IBS treated with belladonna. This is been going on a number of years and she has occasional loose stools and occasional constipation but as long she takes Colace usually does fairly well. Colonoscopy for screening purposes done about 8 years ago revealed diverticulosis. She had a long and tortuous colon and the endoscopist at that time was only able to reach the mid transverse colon. Patient has had only minimal problems with constipation. She states that she has had some cramping pain for the past several days which he interpreted to be IBS and took some increased belladonna. Early Wednesday a.m. she woke up to have a bowel movement with increasing pain and had loose watery stools became bloody. She came to the emergency room. She was slightly hypotensive in the emergency room but responded dramatically to IV fluids. Abdominal CT scan with contrast showed previous cholecystectomy. There was marked inflammatory changes and edema the distal transverse colon the descending colon to the sigmoid. This was segmental the remainder the colon appeared to be reasonably normal. White blood count was slightly elevated but now back to normal. Hemoglobin 14 on admission down to 11 today. Patient reports that her pain is somewhat better but she still is uncomfortable. All appears to be localized to the left side.  Past Medical History  Diagnosis Date  . Diverticulosis 11/2003    Seen on colonoscopy  . Diabetes mellitus   . Hypertension   . COPD (chronic obstructive pulmonary disease)   . Breast cancer 1979    S/p bilateral mastectomy and reconstruction  . 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 Mitral valve prolapse   .  Hypothyroidism   . Complication of anesthesia     demerol    Past Surgical History  Procedure Date  . Ulnar nerve repair     Decompression of ulnar and median nerve at carpal tunnel   . Mastectomy     bilateral, s/p reconstruction    Family History  Problem Relation Age of Onset  . Breast cancer Mother     Deceased at 61  . Parkinsonism Father    No family history of colon cancer there is a family history and grandfather a mini strokes. Social History:  reports that she has been smoking Cigarettes.  She has a 22.5 pack-year smoking history. She has never used smokeless tobacco. She reports that she does not drink alcohol or use illicit drugs.  Allergies:  Allergies  Allergen Reactions  . Demerol Nausea And Vomiting  . Sulfa Antibiotics Hives    Medications;    . amitriptyline  100 mg Oral QHS  . ciprofloxacin  400 mg Intravenous Once  . insulin aspart  0-15 Units Subcutaneous TID WC  . levothyroxine  50 mcg Oral Q0700  . metronidazole  500 mg Intravenous Once  . metronidazole  500 mg Intravenous Q8H  .  morphine injection  4 mg Intravenous Once  . simvastatin  40 mg Oral q1800  . sodium chloride  1,000 mL Intravenous Once  . sodium chloride  500 mL Intravenous Once  . sodium chloride  500 mL Intravenous Once  . DISCONTD: sodium chloride   Intravenous STAT  . DISCONTD: albuterol  2.5 mg Nebulization Q6H  .  DISCONTD: ipratropium  0.5 mg Nebulization Q6H   acetaminophen, acetaminophen, albuterol, albuterol, iohexol, iohexol, ondansetron (ZOFRAN) IV, ondansetron, oxyCODONE, DISCONTD: albuterol Results for orders placed during the hospital encounter of 11/12/11 (from the past 48 hour(s))  CBC     Status: Abnormal   Collection Time   11/12/11  2:26 PM      Component Value Range Comment   WBC 11.2 (*) 4.0 - 10.5 (K/uL)    RBC 4.33  3.87 - 5.11 (MIL/uL)    Hemoglobin 14.0  12.0 - 15.0 (g/dL)    HCT 16.1  09.6 - 04.5 (%)    MCV 92.4  78.0 - 100.0 (fL)    MCH 32.3  26.0  - 34.0 (pg)    MCHC 35.0  30.0 - 36.0 (g/dL)    RDW 40.9  81.1 - 91.4 (%)    Platelets 260  150 - 400 (K/uL)   PROTIME-INR     Status: Normal   Collection Time   11/12/11  2:26 PM      Component Value Range Comment   Prothrombin Time 14.4  11.6 - 15.2 (seconds)    INR 1.10  0.00 - 1.49    COMPREHENSIVE METABOLIC PANEL     Status: Abnormal   Collection Time   11/12/11  2:26 PM      Component Value Range Comment   Sodium 135  135 - 145 (mEq/L)    Potassium 4.5  3.5 - 5.1 (mEq/L)    Chloride 92 (*) 96 - 112 (mEq/L)    CO2 30  19 - 32 (mEq/L)    Glucose, Bld 139 (*) 70 - 99 (mg/dL)    BUN 27 (*) 6 - 23 (mg/dL)    Creatinine, Ser 7.82 (*) 0.50 - 1.10 (mg/dL)    Calcium 95.6  8.4 - 10.5 (mg/dL)    Total Protein 7.9  6.0 - 8.3 (g/dL)    Albumin 4.0  3.5 - 5.2 (g/dL)    AST 22  0 - 37 (U/L)    ALT 23  0 - 35 (U/L)    Alkaline Phosphatase 91  39 - 117 (U/L)    Total Bilirubin 0.4  0.3 - 1.2 (mg/dL)    GFR calc non Af Amer 37 (*) >90 (mL/min)    GFR calc Af Amer 43 (*) >90 (mL/min)   TYPE AND SCREEN     Status: Normal   Collection Time   11/12/11  2:32 PM      Component Value Range Comment   ABO/RH(D) A POS      Antibody Screen NEG      Sample Expiration 11/15/2011     ABO/RH     Status: Normal   Collection Time   11/12/11  2:32 PM      Component Value Range Comment   ABO/RH(D) A POS     CBC     Status: Abnormal   Collection Time   11/13/11 12:32 AM      Component Value Range Comment   WBC 9.3  4.0 - 10.5 (K/uL)    RBC 3.84 (*) 3.87 - 5.11 (MIL/uL)    Hemoglobin 11.9 (*) 12.0 - 15.0 (g/dL)    HCT 21.3 (*) 08.6 - 46.0 (%)    MCV 92.2  78.0 - 100.0 (fL)    MCH 31.0  26.0 - 34.0 (pg)    MCHC 33.6  30.0 - 36.0 (g/dL)    RDW 57.8  46.9 - 62.9 (%)    Platelets 214  150 -  400 (K/uL)   GLUCOSE, CAPILLARY     Status: Abnormal   Collection Time   11/13/11  3:38 AM      Component Value Range Comment   Glucose-Capillary 186 (*) 70 - 99 (mg/dL)   GLUCOSE, CAPILLARY     Status: Abnormal    Collection Time   11/13/11  8:14 AM      Component Value Range Comment   Glucose-Capillary 176 (*) 70 - 99 (mg/dL)    Comment 1 Documented in Chart     LACTIC ACID, PLASMA     Status: Normal   Collection Time   11/13/11  9:10 AM      Component Value Range Comment   Lactic Acid, Venous 1.4  0.5 - 2.2 (mmol/L)   BASIC METABOLIC PANEL     Status: Abnormal   Collection Time   11/13/11  9:10 AM      Component Value Range Comment   Sodium 135  135 - 145 (mEq/L)    Potassium 3.5  3.5 - 5.1 (mEq/L) DELTA CHECK NOTED   Chloride 100  96 - 112 (mEq/L) DELTA CHECK NOTED   CO2 27  19 - 32 (mEq/L)    Glucose, Bld 155 (*) 70 - 99 (mg/dL)    BUN 16  6 - 23 (mg/dL) DELTA CHECK NOTED   Creatinine, Ser 1.02  0.50 - 1.10 (mg/dL)    Calcium 8.3 (*) 8.4 - 10.5 (mg/dL)    GFR calc non Af Amer 53 (*) >90 (mL/min)    GFR calc Af Amer 61 (*) >90 (mL/min)   GLUCOSE, CAPILLARY     Status: Abnormal   Collection Time   11/13/11 12:07 PM      Component Value Range Comment   Glucose-Capillary 136 (*) 70 - 99 (mg/dL)     Ct Abdomen Pelvis W Contrast  11/13/2011  *RADIOLOGY REPORT*  Clinical Data: Rectal bleeding.  Lower abdominal pain.  CT ABDOMEN AND PELVIS WITH CONTRAST  Technique:  Multidetector CT imaging of the abdomen and pelvis was performed following the standard protocol during bolus administration of intravenous contrast.  Contrast: OMNIPAQUE IOHEXOL 300 MG/ML IV SOLN, 40mL OMNIPAQUE IOHEXOL 300 MG/ML IV SOLN  Comparison: None.  Findings: Lung bases appear clear.  Liver demonstrates old granulomatous calcification adjacent to the falciform ligament. Cholecystectomy.  No calcified gallstones.  Atrophy of the pancreas.  Normal renal enhancement and excretion of contrast. Nonobstructing right renal collecting system calculus in the inferior pole measuring 7 mm.  Abdominal aortic atherosclerosis. The adrenal glands appear normal.  Stomach and proximal small bowel appear normal. Prominent stool burden is  present in the right lower quadrant.  The appendix is not identified.  Prominent stool is present in the cecum. There are marked colonic inflammatory changes extending from the distal transverse colon along the descending colon.  There is no perforation.  The inflammatory changes extend through the sigmoid nearly to the rectum.  Uterus and adnexa appear normal.  Urinary bladder normal. Aortoiliac atherosclerosis.  No aggressive osseous lesions.  Large vertebral body hemangioma at T12.  IMPRESSION: 1.  Colitis extending from the distal transverse colon through the sigmoid.  Differential considerations include inflammatory bowel disease, infectious colitis including C difficile, with ischemia felt unlikely based on distribution.  The visualized visceral vessels demonstrate opacification of contrast. 2.  Cholecystectomy. 3.  7 mm nonobstructing right inferior pole renal collecting system calculus.  Original Report Authenticated By: Andreas Newport, M.D.    ROS: GI: Long history of IBS.  No prior history of diverticulitis. No significant problems with dysphagia or reflux. Constitutional: No weight loss. History of type 2 diabetes reports good appetite HEENT: Negative Cardiology: Negative with no prior cardiac history other than mitral valve prolapse Respiratory: COPD due to smoking patient still smoking Hematology/ID: Negative     Blood pressure 95/57, pulse 98, temperature 98.3 F (36.8 C), temperature source Oral, resp. rate 21, height 5\' 2"  (1.575 m), weight 80.6 kg (177 lb 11.1 oz), SpO2 93.00%.  Physical exam:   Gen.-obese white female in no acute distress she is alert and oriented Eyes-sclerae are nonicteric  Neck-no lymphadenopathy Lungs-clear anteriorly and posteriorly Abdomen-obese and slightly distended with bowel sounds present moderately tender in the left side  Assessment/Plan: 1. Acute rectal bleeding/abdominal pain. Her CT scan is quite consistent with ischemic colitis. There is no  signs of active diverticulitis. Patient is known to have diverticula in this could be a diverticular bleed as well. 2. Long history of IBS treated with chronic belladonna 3. Type 2 diabetes 4. COPD  Plan: We will go ahead and arrange colonoscopy/sigmoidoscopy tomorrow. We will try to prep the patient with NuLytely and use enemas in the morning just to clear things out. Given the fact that her previous colonoscopy was incomplete due to a long tortuous colon it is possible that we will not be able to complete the colonoscopy release we'll be able to determine the extent of the damage and obtain tissue biopsies. I have discussed this with the patient.  Lavanda Nevels JR,Scott Fix L 11/13/2011, 1:19 PM

## 2011-11-13 NOTE — Progress Notes (Signed)
Subjective: Pt mentions that she has had abdominal pain on and off.  She states that it starts at her LLQ and Radiates.  Pt poorly localized area when asked.  She states that she lives in [place] and doesn't have a GI doctor there.  Reportedly she has h/o IBD.  Pt states that she hasn't eaten in days and reported that the abd discomfort comes on and off independent of foods.  No abdominal discomfort currently and denies any abd discomfort with bowel movements.  Pt states that she had some BRBPR last night when she had a BM.  Denies any fever or chills.  Objective: Filed Vitals:   11/13/11 0300 11/13/11 0320 11/13/11 0330 11/13/11 0500  BP: 111/39  109/62 113/66  Pulse: 103  101 89  Temp:   99 F (37.2 C) 99.6 F (37.6 C)  TempSrc:   Oral Oral  Resp: 25  22 20   Height:   5\' 2"  (1.575 m)   Weight:   80.6 kg (177 lb 11.1 oz)   SpO2: 93% 91% 92% 90%   Weight change:   Intake/Output Summary (Last 24 hours) at 11/13/11 0835 Last data filed at 11/13/11 0631  Gross per 24 hour  Intake 310.42 ml  Output      0 ml  Net 310.42 ml    General: Alert, awake, oriented x3, in no acute distress.  HEENT: No bruits, no goiter.  Heart: Regular rate and rhythm, without murmurs, rubs, gallops.  Lungs: CTA BL.  Abdomen: Soft, discomfort with deep palpation over LLQ, nondistended, positive bowel sounds.  Neuro: Grossly intact, nonfocal.   Lab Results:  Basename 11/12/11 1426  NA 135  K 4.5  CL 92*  CO2 30  GLUCOSE 139*  BUN 27*  CREATININE 1.36*  CALCIUM 10.4  MG --  PHOS --    Basename 11/12/11 1426  AST 22  ALT 23  ALKPHOS 91  BILITOT 0.4  PROT 7.9  ALBUMIN 4.0   No results found for this basename: LIPASE:2,AMYLASE:2 in the last 72 hours  Basename 11/13/11 0032 11/12/11 1426  WBC 9.3 11.2*  NEUTROABS -- --  HGB 11.9* 14.0  HCT 35.4* 40.0  MCV 92.2 92.4  PLT 214 260   No results found for this basename: CKTOTAL:3,CKMB:3,CKMBINDEX:3,TROPONINI:3 in the last 72 hours No  components found with this basename: POCBNP:3 No results found for this basename: DDIMER:2 in the last 72 hours No results found for this basename: HGBA1C:2 in the last 72 hours No results found for this basename: CHOL:2,HDL:2,LDLCALC:2,TRIG:2,CHOLHDL:2,LDLDIRECT:2 in the last 72 hours No results found for this basename: TSH,T4TOTAL,FREET3,T3FREE,THYROIDAB in the last 72 hours No results found for this basename: VITAMINB12:2,FOLATE:2,FERRITIN:2,TIBC:2,IRON:2,RETICCTPCT:2 in the last 72 hours  Micro Results: No results found for this or any previous visit (from the past 240 hour(s)).  Studies/Results: Ct Abdomen Pelvis W Contrast  11/13/2011  *RADIOLOGY REPORT*  Clinical Data: Rectal bleeding.  Lower abdominal pain.  CT ABDOMEN AND PELVIS WITH CONTRAST  Technique:  Multidetector CT imaging of the abdomen and pelvis was performed following the standard protocol during bolus administration of intravenous contrast.  Contrast: OMNIPAQUE IOHEXOL 300 MG/ML IV SOLN, 40mL OMNIPAQUE IOHEXOL 300 MG/ML IV SOLN  Comparison: None.  Findings: Lung bases appear clear.  Liver demonstrates old granulomatous calcification adjacent to the falciform ligament. Cholecystectomy.  No calcified gallstones.  Atrophy of the pancreas.  Normal renal enhancement and excretion of contrast. Nonobstructing right renal collecting system calculus in the inferior pole measuring 7 mm.  Abdominal  aortic atherosclerosis. The adrenal glands appear normal.  Stomach and proximal small bowel appear normal. Prominent stool burden is present in the right lower quadrant.  The appendix is not identified.  Prominent stool is present in the cecum. There are marked colonic inflammatory changes extending from the distal transverse colon along the descending colon.  There is no perforation.  The inflammatory changes extend through the sigmoid nearly to the rectum.  Uterus and adnexa appear normal.  Urinary bladder normal. Aortoiliac atherosclerosis.   No aggressive osseous lesions.  Large vertebral body hemangioma at T12.  IMPRESSION: 1.  Colitis extending from the distal transverse colon through the sigmoid.  Differential considerations include inflammatory bowel disease, infectious colitis including C difficile, with ischemia felt unlikely based on distribution.  The visualized visceral vessels demonstrate opacification of contrast. 2.  Cholecystectomy. 3.  7 mm nonobstructing right inferior pole renal collecting system calculus.  Original Report Authenticated By: Andreas Newport, M.D.    Medications: I have reviewed the patient's current medications.   Patient Active Hospital Problem List: Colitis (11/13/2011) At this juncture etiology is unclear given history.  Infectious seems less likely although patient had elevated WBC initially. But patient reportedly has history of IBS.  She denies having a GI doctor back home (pt lives in [place]).  Other considerations include but are not limited to diverticular bleed or given history of 25 lbs weight loss in a 76y/o patient a neoplastic process.    Will consult GI for further evaluation/management.  And would very much appreciate their recommendations on disposition after they have worked up the patient.  For now will continue current abx's, npo status, and will obtain daily H/H's.  Diabetes mellitus () Pt currently NPO.  We are holding home medications and placing patient on SSI  COPD (chronic obstructive pulmonary disease) () Stable at this juncture   Hypothyroidism () Pt on synthroid, stable  Bright red blood per rectum (11/13/2011) Please see # 1 above.  GI consulted today  Hypotension (11/13/2011) Pt currently is normotensive 113/66. Not on any antihypertensive medication.  Will continue to monitor blood pressures.  Pt is on atenolol and lisinopril at home and this medication is currently being held.  Acute renal failure (11/13/2011) Likely due to prerenal causes.  Will plan on  hydrating patient and checking BMP tomorrow.     LOS: 1 day   [name] M.D.  [facility] 11/13/2011, 8:35 AM

## 2011-11-14 ENCOUNTER — Encounter (HOSPITAL_COMMUNITY): Payer: Self-pay | Admitting: *Deleted

## 2011-11-14 ENCOUNTER — Other Ambulatory Visit: Payer: Self-pay | Admitting: Gastroenterology

## 2011-11-14 ENCOUNTER — Encounter (HOSPITAL_COMMUNITY)
Admission: EM | Disposition: A | Discharge status: Home / Self Care | Payer: Self-pay | Source: Home / Self Care | Attending: Family Medicine

## 2011-11-14 HISTORY — PX: COLONOSCOPY: SHX5424

## 2011-11-14 LAB — BASIC METABOLIC PANEL
BUN: 8 mg/dL (ref 6–23)
Chloride: 103 mEq/L (ref 96–112)
GFR calc Af Amer: 79 mL/min — ABNORMAL LOW (ref >90)
GFR calc non Af Amer: 68 mL/min — ABNORMAL LOW (ref >90)
Potassium: 3.4 mEq/L — ABNORMAL LOW (ref 3.5–5.1)
Sodium: 137 mEq/L (ref 135–145)

## 2011-11-14 LAB — CBC
HCT: 32.4 % — ABNORMAL LOW (ref 36.0–46.0)
Hemoglobin: 10.7 g/dL — ABNORMAL LOW (ref 12.0–15.0)
MCH: 31 pg (ref 26.0–34.0)
MCH: 32.2 pg (ref 26.0–34.0)
MCHC: 33.6 g/dL (ref 30.0–36.0)
MCHC: 34.5 g/dL (ref 30.0–36.0)
Platelets: 188 10*3/uL (ref 150–400)
Platelets: 186 10*3/uL (ref 150–400)
Platelets: 186 10*3/uL (ref 150–400)
RBC: 3.45 MIL/uL — ABNORMAL LOW (ref 3.87–5.11)
RBC: 3.57 MIL/uL — ABNORMAL LOW (ref 3.87–5.11)
RDW: 13.1 % (ref 11.5–15.5)
WBC: 9.3 10*3/uL (ref 4.0–10.5)

## 2011-11-14 LAB — CLOSTRIDIUM DIFFICILE BY PCR: Toxigenic C. Difficile by PCR: NEGATIVE

## 2011-11-14 LAB — GLUCOSE, CAPILLARY
Glucose-Capillary: 110 mg/dL — ABNORMAL HIGH (ref 70–99)
Glucose-Capillary: 119 mg/dL — ABNORMAL HIGH (ref 70–99)
Glucose-Capillary: 125 mg/dL — ABNORMAL HIGH (ref 70–99)

## 2011-11-14 SURGERY — COLONOSCOPY
Anesthesia: Moderate Sedation

## 2011-11-14 MED ORDER — FENTANYL CITRATE 0.05 MG/ML IJ SOLN
INTRAMUSCULAR | Status: AC
  Filled 2011-11-14: qty 2

## 2011-11-14 MED ORDER — KCL IN DEXTROSE-NACL 20-5-0.45 MEQ/L-%-% IV SOLN
INTRAVENOUS | Status: DC
  Administered 2011-11-14 – 2011-11-17 (×8): via INTRAVENOUS
  Filled 2011-11-14 (×12): qty 1000

## 2011-11-14 MED ORDER — MIDAZOLAM HCL 5 MG/5ML IJ SOLN
INTRAMUSCULAR | Status: DC | PRN
  Administered 2011-11-14 (×2): 2 mg via INTRAVENOUS
  Administered 2011-11-14: 1 mg via INTRAVENOUS

## 2011-11-14 MED ORDER — FENTANYL NICU IV SYRINGE 50 MCG/ML
INJECTION | INTRAMUSCULAR | Status: DC | PRN
  Administered 2011-11-14 (×2): 12.5 ug via INTRAVENOUS
  Administered 2011-11-14: 25 ug via INTRAVENOUS

## 2011-11-14 MED ORDER — DIPHENHYDRAMINE HCL 50 MG/ML IJ SOLN
INTRAMUSCULAR | Status: AC
  Filled 2011-11-14: qty 1

## 2011-11-14 MED ORDER — MIDAZOLAM HCL 10 MG/2ML IJ SOLN
INTRAMUSCULAR | Status: AC
  Filled 2011-11-14: qty 2

## 2011-11-14 MED ORDER — POLYETHYLENE GLYCOL 3350 17 G PO PACK
17.0000 g | PACK | Freq: Two times a day (BID) | ORAL | Status: DC
  Administered 2011-11-14 – 2011-11-17 (×2): 17 g via ORAL
  Filled 2011-11-14 (×10): qty 1

## 2011-11-14 MED ORDER — DIPHENHYDRAMINE HCL 50 MG/ML IJ SOLN
INTRAMUSCULAR | Status: DC | PRN
  Administered 2011-11-14: 25 mg via INTRAVENOUS

## 2011-11-14 NOTE — Op Note (Signed)
[facility] [address] [place], [place]  [address]  COLONOSCOPY PROCEDURE REPORT  PATIENT:  Miranda Erickson, Miranda Erickson  MR#:  [id number] BIRTHDATE:  13-Feb-1935, 76 yrs. old  GENDER:  female ENDOSCOPIST:  Carman Ching REF. BY:  Burton Apley, M.D. [facility], PROCEDURE DATE:  11/14/2011 PROCEDURE:  Colonoscopy aborted. Flexible sigmoidoscopy with biopsy. ASA CLASS:  Class III INDICATIONS:  Abdominal pain this was associated with rectal bleeding and diarrhea. CT scan showed segmental colitis from the mid transverse colon down to the sigmoid colon. MEDICATIONS:   Benadryl 25, Fentanyl 50 mcg IV, Versed 5 mg IV  DESCRIPTION OF PROCEDURE:   After the risks and benefits and of the procedure were explained, informed consent was obtained. Digital rectal exam was performed and revealed no abnormalities. The EC-[id number] ([id number]) endoscope was introduced through the anus and advanced to the descending colon.  The quality of the prep was fair..  The instrument was then slowly withdrawn as the colon was fully examined. <<PROCEDUREIMAGES>>  FINDINGS:  Moderate diverticulosis was found in the descending colon.  Colitis was found in the descending colon. this was characterized by marked edema and ulceration with flat confluent ulcers. This was segmental with a clear transition zone in the proximal sigmoid. The sigmoid colon itself looked fairly normal. multiple biopsies were taken. With standard forceps, biopsy was obtained and sent to pathology.   Retroflexion was not performed. The scope was then withdrawn from the patient and the procedure completed.  COMPLICATIONS:  A complication of none occured on 11/14/2011 at. ENDOSCOPIC IMPRESSION: 1) Moderate diverticulosis in the descending colon 2) Colitis in the descending colon this was most consistent with ischemic colitis. RECOMMENDATIONS: we'll continue the patient n.p.o. and on antibiotics. We'll give her ice chips  and MiraLAX. Hopefully in a few days we will be able to advance her diet.  REPEAT EXAM:  No  ______________________________ Carman Ching  CC:Dr   Ron Su Hilt  n. eSIGNED:   Carman Ching at 11/14/2011 10:52 AM  Jed Limerick, [phone]

## 2011-11-14 NOTE — Progress Notes (Signed)
Subjective: Pt denies any new bouts of hematochezia or BRBPR.  Was reportedly scoped today GI found moderate diverticulosis, colitis.  Pt to remain NPO reportedly.  No acute issue overnight  Objective: Filed Vitals:   11/14/11 1120 11/14/11 1159 11/14/11 1459 11/14/11 1841  BP: 132/72 114/71  153/80  Pulse:  101 115 83  Temp:  98.9 F (37.2 C)    TempSrc:  Oral    Resp: 11 16  18   Height:      Weight:      SpO2: 94% 94% 95% 95%   Weight change: 0.7 kg (1 lb 8.7 oz)  Intake/Output Summary (Last 24 hours) at 11/14/11 1855 Last data filed at 11/14/11 1600  Gross per 24 hour  Intake   1450 ml  Output    850 ml  Net    600 ml    General: Alert, awake, oriented x3, in no acute distress.  HEENT: No bruits, no goiter.  Heart: Regular rate and rhythm, without murmurs, rubs, gallops.  Lungs: CTA BL.  Abdomen: Soft, discomfort with deep palpation over LLQ, nondistended, positive bowel sounds.  Neuro: Grossly intact, nonfocal.    Lab Results:  Basename 11/14/11 0630 11/13/11 0910  NA 137 135  K 3.4* 3.5  CL 103 100  CO2 24 27  GLUCOSE 130* 155*  BUN 8 16  CREATININE 0.83 1.02  CALCIUM 8.1* 8.3*  MG -- --  PHOS -- --    Basename 11/12/11 1426  AST 22  ALT 23  ALKPHOS 91  BILITOT 0.4  PROT 7.9  ALBUMIN 4.0   No results found for this basename: LIPASE:2,AMYLASE:2 in the last 72 hours  Basename 11/14/11 1739 11/14/11 1202  WBC 7.9 8.0  NEUTROABS -- --  HGB 11.5* 10.7*  HCT 33.3* 32.4*  MCV 93.3 93.9  PLT 186 186   No results found for this basename: CKTOTAL:3,CKMB:3,CKMBINDEX:3,TROPONINI:3 in the last 72 hours No components found with this basename: POCBNP:3 No results found for this basename: DDIMER:2 in the last 72 hours No results found for this basename: HGBA1C:2 in the last 72 hours No results found for this basename: CHOL:2,HDL:2,LDLCALC:2,TRIG:2,CHOLHDL:2,LDLDIRECT:2 in the last 72 hours No results found for this basename:  TSH,T4TOTAL,FREET3,T3FREE,THYROIDAB in the last 72 hours No results found for this basename: VITAMINB12:2,FOLATE:2,FERRITIN:2,TIBC:2,IRON:2,RETICCTPCT:2 in the last 72 hours  Micro Results: Recent Results (from the past 240 hour(s))  CLOSTRIDIUM DIFFICILE BY PCR     Status: Normal   Collection Time   11/13/11 11:41 PM      Component Value Range Status Comment   C difficile by pcr NEGATIVE  NEGATIVE  Final     Studies/Results: Ct Abdomen Pelvis W Contrast  11/13/2011  *RADIOLOGY REPORT*  Clinical Data: Rectal bleeding.  Lower abdominal pain.  CT ABDOMEN AND PELVIS WITH CONTRAST  Technique:  Multidetector CT imaging of the abdomen and pelvis was performed following the standard protocol during bolus administration of intravenous contrast.  Contrast: OMNIPAQUE IOHEXOL 300 MG/ML IV SOLN, 40mL OMNIPAQUE IOHEXOL 300 MG/ML IV SOLN  Comparison: None.  Findings: Lung bases appear clear.  Liver demonstrates old granulomatous calcification adjacent to the falciform ligament. Cholecystectomy.  No calcified gallstones.  Atrophy of the pancreas.  Normal renal enhancement and excretion of contrast. Nonobstructing right renal collecting system calculus in the inferior pole measuring 7 mm.  Abdominal aortic atherosclerosis. The adrenal glands appear normal.  Stomach and proximal small bowel appear normal. Prominent stool burden is present in the right lower quadrant.  The appendix is  not identified.  Prominent stool is present in the cecum. There are marked colonic inflammatory changes extending from the distal transverse colon along the descending colon.  There is no perforation.  The inflammatory changes extend through the sigmoid nearly to the rectum.  Uterus and adnexa appear normal.  Urinary bladder normal. Aortoiliac atherosclerosis.  No aggressive osseous lesions.  Large vertebral body hemangioma at T12.  IMPRESSION: 1.  Colitis extending from the distal transverse colon through the sigmoid.  Differential  considerations include inflammatory bowel disease, infectious colitis including C difficile, with ischemia felt unlikely based on distribution.  The visualized visceral vessels demonstrate opacification of contrast. 2.  Cholecystectomy. 3.  7 mm nonobstructing right inferior pole renal collecting system calculus.  Original Report Authenticated By: Andreas Newport, M.D.    Medications: I have reviewed the patient's current medications.   Patient Active Hospital Problem List: Colitis (11/13/2011) Per GI.  Will f/u with their recommendations  Diabetes mellitus () Blood sugars 110-144.  Pt is currently NPO.  Will continue to monitor blood sugars.  COPD (chronic obstructive pulmonary disease) () Stable  Hypertension () Has fluctuated today.  Will continue to monitor if higher readings persist will plan on adjusting blood pressure medication.  Hypothyroidism ()  Stable  Bright red blood per rectum (11/13/2011) Resolved, GI on board thought to have been secondary to ischemic colitis.  Acute renal failure (11/13/2011) Last creatinine 0.83, currently resolved may have been due to prerenal cause given history of poor po intake.  Pt is currently on IVF's may switch to MIVF's given npo status.     LOS: 2 days   [name] M.D.  [facility] 11/14/2011, 6:55 PM

## 2011-11-14 NOTE — Progress Notes (Signed)
Occupational Therapy Evaluation Patient Details Name: Miranda Erickson MRN: [id number] DOB: 04-23-1935 Today's Date: 11/14/2011  Problem List:  Patient Active Problem List  Diagnoses  . Diabetes mellitus  . COPD (chronic obstructive pulmonary disease)  . Hypertension  . Hypothyroidism  . Colitis  . Bright red blood per rectum  . Acute renal failure    Past Medical History:  Past Medical History  Diagnosis Date  . Diverticulosis 11/2003    Seen on colonoscopy  . Diabetes mellitus   . Hypertension   . COPD (chronic obstructive pulmonary disease)   . 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 Mitral valve prolapse   . Hypothyroidism   . Complication of anesthesia     demerol, vomiting  . Breast cancer 1979    S/p bilateral mastectomy and reconstruction   Past Surgical History:  Past Surgical History  Procedure Date  . Ulnar nerve repair     Decompression of ulnar and median nerve at carpal tunnel   . Mastectomy     bilateral, s/p reconstruction  . Appendectomy   . Cholecystectomy   . Tonsillectomy     OT Assessment/Plan/Recommendation OT Assessment Clinical Impression Statement: Patient presented to ED with c/o rectal bleeding and diffuse lower abdominal pain which is worse in the left lower quadrant.  Patient presents with decreased strength, balance, and poor activity tolerance.  Patient's daughter present during assessment and both state that they do not want further OT services that if patient needs assistance at discharge, daughter will provide.  No DME recommendations secondary to patient states she has all of the DME she might need already.    OT Recommendation/Assessment: Patient does not need any further OT services Follow Up Recommendations: No OT follow up  OT Evaluation Precautions/Restrictions  Precautions Precautions: Fall Precaution Comments: contact isolation Required Braces or Orthoses:  No Restrictions Weight Bearing Restrictions: No Prior Functioning Home Living Lives With: Alone Receives Help From: Family (daughter lives 3 miles away and will assist 24/7 if needed) Type of Home: House Home Layout: One level Home Access: Stairs to enter Secretary/administrator of Steps: 4-5 Bathroom Shower/Tub: Engineer, manufacturing systems: Standard Home Adaptive Equipment: Paediatric nurse with back;Raised toilet seat with rails Additional Comments: has rollator Prior Function Level of Independence: Independent with basic ADLs;Requires assistive device for independence;Independent with homemaking with ambulation;Independent with homemaking with wheelchair;Independent with transfers (uses rollator at all times) Driving: No Vocation: Retired Comments: patient reports that she never wears socks or shoes at home ADL ADL Grooming: Not assessed Upper Body Bathing: Simulated;Set up Where Assessed - Upper Body Bathing: Sitting, bed Lower Body Bathing: Simulated;Minimal assistance Where Assessed - Lower Body Bathing: Sit to stand from bed Upper Body Dressing: Not assessed Lower Body Dressing: Simulated;Moderate assistance Toilet Transfer: Not assessed ADL Comments: patient and daughter agree that patient will only take shower when daughter is present Cognition Cognition Arousal/Alertness: Awake/alert Overall Cognitive Status: Appears within functional limits for tasks assessed Sensation/Coordination Sensation Light Touch: Appears Intact Proprioception: Appears Intact Extremity Assessment RUE Assessment RUE Assessment: Within Functional Limits LUE Assessment LUE Assessment: Within Functional Limits Mobility  Bed Mobility Bed Mobility: Yes Supine to Sit: 6: Modified independent (Device/Increase time) (with HOB raised) Sit to Supine: 6: Modified independent (Device/Increase time) Transfers Sit to Stand: 4: Min assist Sit to Stand Details (indicate cue type and reason):  steadying assist End of Session OT - End of Session Activity Tolerance: Patient limited  by pain;Patient limited by fatigue Patient left: in bed;with family/visitor present General Behavior During Session: [facility] for tasks performed Cognition: [facility] for tasks performed   Icyss Skog 11/14/2011, 4:06 PM  [phone]

## 2011-11-14 NOTE — ED Provider Notes (Signed)
Medical screening examination/treatment/procedure(s) were conducted as a shared visit with non-physician practitioner(s) and myself.  I personally evaluated the patient during the encounter  Flint Melter, MD 11/14/11 2028

## 2011-11-14 NOTE — Progress Notes (Signed)
Physical Therapy Evaluation Patient Details Name: Miranda Erickson MRN: [id number] DOB: April 17, 1935 Today's Date: 11/14/2011  Problem List:  Patient Active Problem List  Diagnoses  . Diabetes mellitus  . COPD (chronic obstructive pulmonary disease)  . Hypertension  . Hypothyroidism  . Colitis  . Bright red blood per rectum  . Acute renal failure    Past Medical History:  Past Medical History  Diagnosis Date  . Diverticulosis 11/2003    Seen on colonoscopy  . Diabetes mellitus   . Hypertension   . COPD (chronic obstructive pulmonary disease)   . 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 Mitral valve prolapse   . Hypothyroidism   . Complication of anesthesia     demerol, vomiting  . Breast cancer 1979    S/p bilateral mastectomy and reconstruction   Past Surgical History:  Past Surgical History  Procedure Date  . Ulnar nerve repair     Decompression of ulnar and median nerve at carpal tunnel   . Mastectomy     bilateral, s/p reconstruction  . Appendectomy   . Cholecystectomy   . Tonsillectomy     PT Assessment/Plan/Recommendation PT Assessment Clinical Impression Statement: Pt presents with decreased strength, decreased balance, impaired gait, decreased activity tolerance and will benefit from acute PT services to address deficits and increase functional mobility for safe d/c home. PT Recommendation/Assessment: Patient will need skilled PT in the acute care venue PT Problem List: Decreased strength;Decreased mobility;Decreased activity tolerance;Decreased balance;Cardiopulmonary status limiting activity Problem List Comments: SOB with activity, O2 sats 93-95% on room air during gait PT Therapy Diagnosis : Difficulty walking;Generalized weakness PT Plan PT Frequency: Min 3X/week PT Treatment/Interventions: DME instruction;Gait training;Stair training;Functional mobility training;Balance training;Therapeutic  exercise;Therapeutic activities;Patient/family education PT Recommendation Follow Up Recommendations: Home health PT PT Goals  Acute Rehab PT Goals PT Goal Formulation: With patient Time For Goal Achievement: 7 days Pt will Transfer Bed to Chair/Chair to Bed: with modified independence Pt will Ambulate: 16 - 50 feet;with modified independence Pt will Go Up / Down Stairs: 3-5 stairs;with min assist  PT Evaluation Precautions/Restrictions  Precautions Precautions: Fall Precaution Comments: contact isolation Required Braces or Orthoses: No Restrictions Weight Bearing Restrictions: No Prior Functioning  Home Living Lives With: Alone Receives Help From: Family Type of Home: House Home Layout: One level Home Access: Stairs to enter [employer] of Steps: 4-5 Additional Comments: has rollator Prior Function Level of Independence: Independent with basic ADLs;Requires assistive device for independence (uses rollator at all times) Driving: No Vocation: Retired Comments: reports she never wears socks or shoes because she cannot put them on Cognition Cognition Arousal/Alertness: Awake/alert Overall Cognitive Status: Appears within functional limits for tasks assessed Sensation/Coordination Sensation Light Touch: Appears Intact Proprioception: Appears Intact Extremity Assessment RLE Assessment RLE Assessment: Within Functional Limits LLE Assessment LLE Assessment: Within Functional Limits Mobility (including Balance) Bed Mobility Bed Mobility: Yes Supine to Sit: 6: Modified independent (Device/Increase time) (with HOB raised) Sit to Supine: 6: Modified independent (Device/Increase time) Transfers Transfers: Yes Sit to Stand: 4: Min assist Sit to Stand Details (indicate cue type and reason): steadying assist Stand Pivot Transfers: 4: Min assist Stand Pivot Transfer Details (indicate cue type and reason): steadying assist with RW Ambulation/Gait Ambulation/Gait:  Yes Ambulation/Gait Assistance: 4: Min assist Ambulation/Gait Assistance Details (indicate cue type and reason): with RW, steadying assist with turns Ambulation Distance (Feet): 100 Feet Assistive device: Rolling walker Gait Pattern: Decreased stride length Gait  velocity: slow Stairs: No Wheelchair Mobility Wheelchair Mobility: No  Posture/Postural Control Posture/Postural Control: No significant limitations Balance Balance Assessed: Yes Static Sitting Balance Static Sitting - Level of Assistance: 6: Modified independent (Device/Increase time) Static Standing Balance Static Standing - Level of Assistance: 5: Stand by assistance (with UE support) Dynamic Standing Balance Dynamic Standing - Level of Assistance: 4: Min assist (with UE support) End of Session PT - End of Session Equipment Utilized During Treatment: Gait belt Activity Tolerance: Patient limited by fatigue (required frequent rests) Patient left: in bed;with call bell in reach;with family/visitor present General Behavior During Session: [facility] for tasks performed Cognition: [facility] for tasks performed  Miranda Erickson 11/14/2011, 3:12 PM

## 2011-11-15 DIAGNOSIS — M109 Gout, unspecified: Secondary | ICD-10-CM

## 2011-11-15 LAB — GLUCOSE, CAPILLARY
Glucose-Capillary: 159 mg/dL — ABNORMAL HIGH (ref 70–99)
Glucose-Capillary: 205 mg/dL — ABNORMAL HIGH (ref 70–99)
Glucose-Capillary: 150 mg/dL — ABNORMAL HIGH (ref 70–99)

## 2011-11-15 LAB — CBC
HCT: 31.8 % — ABNORMAL LOW (ref 36.0–46.0)
HCT: 33.8 % — ABNORMAL LOW (ref 36.0–46.0)
Hemoglobin: 11 g/dL — ABNORMAL LOW (ref 12.0–15.0)
Hemoglobin: 11.6 g/dL — ABNORMAL LOW (ref 12.0–15.0)
MCH: 32.4 pg (ref 26.0–34.0)
MCH: 31.7 pg (ref 26.0–34.0)
MCHC: 34.6 g/dL (ref 30.0–36.0)
MCHC: 34.3 g/dL (ref 30.0–36.0)
MCV: 93.8 fL (ref 78.0–100.0)
RBC: 3.39 MIL/uL — ABNORMAL LOW (ref 3.87–5.11)
RBC: 3.66 MIL/uL — ABNORMAL LOW (ref 3.87–5.11)

## 2011-11-15 MED ORDER — COLCHICINE 0.6 MG PO TABS
1.2000 mg | ORAL_TABLET | Freq: Once | ORAL | Status: AC
  Administered 2011-11-15: 1.2 mg via ORAL
  Filled 2011-11-15: qty 2

## 2011-11-15 MED ORDER — COLCHICINE 0.6 MG PO TABS
0.6000 mg | ORAL_TABLET | Freq: Once | ORAL | Status: AC
  Administered 2011-11-15: 0.6 mg via ORAL
  Filled 2011-11-15: qty 1

## 2011-11-15 NOTE — Progress Notes (Signed)
Subjective: Less abdominal pain. No further blood in stool. Tolerated clear liquids.   Objective: Vital signs in last 24 hours: Temp:  [98.2 F (36.8 C)-98.9 F (37.2 C)] 98.6 F (37 C) (01/12 0936) Pulse Rate:  [83-116] 116  (01/12 0936) Resp:  [16-22] 21  (01/12 0936) BP: (114-153)/(62-86) 147/86 mmHg (01/12 0936) SpO2:  [92 %-96 %] 95 % (01/12 0936) Weight:  [91.7 kg (202 lb 2.6 oz)] 91.7 kg (202 lb 2.6 oz) (01/11 2012) Weight change: 10.4 kg (22 lb 14.9 oz) Last BM Date: 11/14/11  PE: GEN:  Overweight, NAD ABD:  Protuberant, soft, mild LLQ tenderness EXT:  Focal erythema and tenderness left metatarsophalangeal joint consistent with gout.  CBC    Component Value Date/Time   WBC 7.4 11/15/2011 0825   RBC 3.39* 11/15/2011 0825   HGB 11.0* 11/15/2011 0825   HCT 31.8* 11/15/2011 0825   PLT 196 11/15/2011 0825   MCV 93.8 11/15/2011 0825   MCH 32.4 11/15/2011 0825   MCHC 34.6 11/15/2011 0825   RDW 13.1 11/15/2011 0825   Assessment:  1.  Abdominal pain. 2.  Blood in stool. 3.  Presumed ischemic colitis, biopsies pending, likely cause of #1 and #2 above, improving. 4.  Gout.  Plan:  1.  Advance diet. 2.  Await biopsies. 3.  10-day total course of metronidazole is not unreasonable. 4.  Hopefully home in next day or two. 5.  Asked patient to discuss optimal gout treatment with hospitalist team (would avoid NSAIDs for sure, colchicine could worsen diarrhea). 6.  Will revisit Monday if she is still in the hospital.  Please call in meantime with any questions.   Freddy Jaksch 11/15/2011, 11:52 AM

## 2011-11-15 NOTE — Progress Notes (Signed)
Subjective: Pt seems to continue improving clinically.  Denies any blood in stools since yesterday morning.  Mentions that her left big toe has been uncomfortable.  States she has a history of gout and thinks that her gout is flaring up.  No acute issues overnight.  Objective: Filed Vitals:   11/14/11 2142 11/15/11 0430 11/15/11 0936 11/15/11 1300  BP: 131/75 142/62 147/86 128/84  Pulse: 105 106 116 113  Temp: 98.5 F (36.9 C) 98.2 F (36.8 C) 98.6 F (37 C) 98.4 F (36.9 C)  TempSrc: Oral Oral Oral Oral  Resp: 20 22 21 20   Height:      Weight:      SpO2: 96% 92% 95% 95%   Weight change: 10.4 kg (22 lb 14.9 oz)  Intake/Output Summary (Last 24 hours) at 11/15/11 1815 Last data filed at 11/15/11 1300  Gross per 24 hour  Intake 2381.67 ml  Output      0 ml  Net 2381.67 ml    General: Alert, awake, oriented x3, in no acute distress.  HEENT: No bruits, no goiter.  Heart: Regular rate and rhythm, without murmurs, rubs, gallops.  Lungs: CTA BL.  Abdomen: Soft, discomfort with deep palpation over LLQ, nondistended, positive bowel sounds.  Neuro: Grossly intact, nonfocal.   Lab Results:  Basename 11/14/11 0630 11/13/11 0910  NA 137 135  K 3.4* 3.5  CL 103 100  CO2 24 27  GLUCOSE 130* 155*  BUN 8 16  CREATININE 0.83 1.02  CALCIUM 8.1* 8.3*  MG -- --  PHOS -- --   No results found for this basename: AST:2,ALT:2,ALKPHOS:2,BILITOT:2,PROT:2,ALBUMIN:2 in the last 72 hours No results found for this basename: LIPASE:2,AMYLASE:2 in the last 72 hours  Basename 11/15/11 1750 11/15/11 0825  WBC 7.9 7.4  NEUTROABS -- --  HGB 11.6* 11.0*  HCT 33.8* 31.8*  MCV 92.3 93.8  PLT 208 196   No results found for this basename: CKTOTAL:3,CKMB:3,CKMBINDEX:3,TROPONINI:3 in the last 72 hours No components found with this basename: POCBNP:3 No results found for this basename: DDIMER:2 in the last 72 hours No results found for this basename: HGBA1C:2 in the last 72 hours No results found  for this basename: CHOL:2,HDL:2,LDLCALC:2,TRIG:2,CHOLHDL:2,LDLDIRECT:2 in the last 72 hours No results found for this basename: TSH,T4TOTAL,FREET3,T3FREE,THYROIDAB in the last 72 hours No results found for this basename: VITAMINB12:2,FOLATE:2,FERRITIN:2,TIBC:2,IRON:2,RETICCTPCT:2 in the last 72 hours  Micro Results: Recent Results (from the past 240 hour(s))  CLOSTRIDIUM DIFFICILE BY PCR     Status: Normal   Collection Time   11/13/11 11:41 PM      Component Value Range Status Comment   C difficile by pcr NEGATIVE  NEGATIVE  Final     Studies/Results: No results found.  Medications: I have reviewed the patient's current medications.   Patient Active Hospital Problem List: Colitis (11/13/2011) At this point GI managing.  Currently patient has been able to advance her diet.  Acute gout:  At this point will plan on starting colchicine.  Diabetes mellitus () Blood sugars have been well controlled.  COPD (chronic obstructive pulmonary disease) ()  Stable currently  Hypertension () Will continue current regimen and continue to monitor  Hypothyroidism () Stable  Bright red blood per rectum (11/13/2011) Secondary to colitis per GI.  Currently has resolved.  Disposition:  Will f/u with GI recommendations if patient has no more BRBPR and no other acute issues arise then would consider D/C in 1-2 days.   LOS: 3 days   [name] M.D.  [facility] 11/15/2011, 6:15 PM

## 2011-11-16 LAB — CBC
HCT: 31.8 % — ABNORMAL LOW (ref 36.0–46.0)
Hemoglobin: 10.8 g/dL — ABNORMAL LOW (ref 12.0–15.0)
Hemoglobin: 10.9 g/dL — ABNORMAL LOW (ref 12.0–15.0)
MCH: 31.4 pg (ref 26.0–34.0)
MCHC: 34 g/dL (ref 30.0–36.0)
MCV: 92.8 fL (ref 78.0–100.0)
Platelets: 232 10*3/uL (ref 150–400)
RBC: 3.47 MIL/uL — ABNORMAL LOW (ref 3.87–5.11)
RDW: 13.1 % (ref 11.5–15.5)
WBC: 5 10*3/uL (ref 4.0–10.5)
WBC: 4.6 10*3/uL (ref 4.0–10.5)

## 2011-11-16 LAB — GLUCOSE, CAPILLARY
Glucose-Capillary: 217 mg/dL — ABNORMAL HIGH (ref 70–99)
Glucose-Capillary: 245 mg/dL — ABNORMAL HIGH (ref 70–99)
Glucose-Capillary: 230 mg/dL — ABNORMAL HIGH (ref 70–99)
Glucose-Capillary: 256 mg/dL — ABNORMAL HIGH (ref 70–99)

## 2011-11-16 NOTE — Progress Notes (Signed)
Subjective: Yesterday was paged by evening nurse that reported that patient had some blood with BM.  He stated that it wasn't a lot and when I asked patient she stated that she didn't see any blood.  Patient currently feels better.  No other acute issues reported.   Objective: Filed Vitals:   11/15/11 1816 11/15/11 2225 11/16/11 0414 11/16/11 0900  BP: 143/75 134/76 137/68 137/74  Pulse: 119 107 111 100  Temp: 100.1 F (37.8 C) 98 F (36.7 C) 98.9 F (37.2 C) 98.3 F (36.8 C)  TempSrc: Oral Oral Oral Oral  Resp: 21 20 18 18   Height:      Weight:  91.5 kg (201 lb 11.5 oz)    SpO2: 96% 94% 91% 93%   Weight change: -0.2 kg (-7.1 oz)  Intake/Output Summary (Last 24 hours) at 11/16/11 1440 Last data filed at 11/16/11 0900  Gross per 24 hour  Intake   1710 ml  Output      0 ml  Net   1710 ml    General: Alert, awake, oriented x3, in no acute distress.  HEENT: No bruits, no goiter.  Heart: Regular rate and rhythm, without murmurs, rubs, gallops.  Lungs: CTA BL, no wheeze   Abdomen: Soft, tenderness on palpation over LLQ, nondistended, positive bowel sounds.  Neuro: Grossly intact, nonfocal.   Lab Results:  Basename 11/14/11 0630  NA 137  K 3.4*  CL 103  CO2 24  GLUCOSE 130*  BUN 8  CREATININE 0.83  CALCIUM 8.1*  MG --  PHOS --   No results found for this basename: AST:2,ALT:2,ALKPHOS:2,BILITOT:2,PROT:2,ALBUMIN:2 in the last 72 hours No results found for this basename: LIPASE:2,AMYLASE:2 in the last 72 hours  Basename 11/16/11 0928 11/15/11 1750  WBC 5.0 7.9  NEUTROABS -- --  HGB 10.8* 11.6*  HCT 31.8* 33.8*  MCV 92.4 92.3  PLT 225 208   No results found for this basename: CKTOTAL:3,CKMB:3,CKMBINDEX:3,TROPONINI:3 in the last 72 hours No components found with this basename: POCBNP:3 No results found for this basename: DDIMER:2 in the last 72 hours No results found for this basename: HGBA1C:2 in the last 72 hours No results found for this basename:  CHOL:2,HDL:2,LDLCALC:2,TRIG:2,CHOLHDL:2,LDLDIRECT:2 in the last 72 hours No results found for this basename: TSH,T4TOTAL,FREET3,T3FREE,THYROIDAB in the last 72 hours No results found for this basename: VITAMINB12:2,FOLATE:2,FERRITIN:2,TIBC:2,IRON:2,RETICCTPCT:2 in the last 72 hours  Micro Results: Recent Results (from the past 240 hour(s))  STOOL CULTURE     Status: Normal (Preliminary result)   Collection Time   11/13/11 11:41 PM      Component Value Range Status Comment   Specimen Description STOOL   Final    Special Requests NONE   Final    Culture Culture reincubated for better growth   Final    Report Status PENDING   Incomplete   CLOSTRIDIUM DIFFICILE BY PCR     Status: Normal   Collection Time   11/13/11 11:41 PM      Component Value Range Status Comment   C difficile by pcr NEGATIVE  NEGATIVE  Final     Studies/Results: No results found.  Medications: I have reviewed the patient's current medications.   Patient Active Hospital Problem List: Colitis (11/13/2011) Per GI.  Will f/u with their recommendations.  Diabetes mellitus () Continue monitoring CBG's.  Blood sugars have ranged from 150-245.  Will plan on continuing to monitor  COPD (chronic obstructive pulmonary disease) () Stable  Hypertension () Pt's last BP 137/74 currently well controlled.  Hypothyroidism ()  Stable  Bright red blood per rectum (11/13/2011)  Slowing down.  None reported today  Acute gout (11/15/2011) Pt was given colchicine yesterday and today reports improvement   Disposition: Given that patient just had her diet advanced and recent reporting (yesterday evening) of blood in the stool.  Will plan on observing one more day and f/u with GI regarding disposition.   LOS: 4 days   [name] M.D.  [facility] 11/16/2011, 2:40 PM

## 2011-11-17 ENCOUNTER — Encounter (HOSPITAL_COMMUNITY): Payer: Self-pay | Admitting: Gastroenterology

## 2011-11-17 LAB — CBC
MCHC: 34.1 g/dL (ref 30.0–36.0)
Platelets: 237 10*3/uL (ref 150–400)
RDW: 13 % (ref 11.5–15.5)
WBC: 4.7 10*3/uL (ref 4.0–10.5)

## 2011-11-17 LAB — GLUCOSE, CAPILLARY

## 2011-11-17 MED ORDER — COLCHICINE 0.6 MG PO TABS: 0.6000 mg | ORAL_TABLET | Freq: Every day | ORAL | Status: DC

## 2011-11-17 MED ORDER — COLCHICINE 0.6 MG PO TABS
0.6000 mg | ORAL_TABLET | Freq: Every day | ORAL | Status: DC
  Administered 2011-11-17: 0.6 mg via ORAL
  Filled 2011-11-17: qty 1

## 2011-11-17 MED ORDER — METRONIDAZOLE 500 MG PO TABS: 500.0000 mg | ORAL_TABLET | Freq: Three times a day (TID) | ORAL | Status: AC

## 2011-11-17 NOTE — Progress Notes (Signed)
PT Discharge Note  Patient is being discharged from PT services secondary to:    Goals met and no further therapy needs identified.  Please see latest Therapy Progress Note for current level of functioning and progress toward goals.  Progress and discharge plan and discussed with patient/caregiver and they Agree.    Jessalyn Hinojosa L. Shamariah Shewmake DPT [phone] 11/17/2011

## 2011-11-17 NOTE — Progress Notes (Signed)
11/17/2011 Ruthetta Koopmann [facility] Case Management Note [phone]  Utilization review completed.  

## 2011-11-17 NOTE — Discharge Summary (Signed)
Admit date: 11/12/2011 Discharge date: 11/17/2011  Primary Care Physician:  Lorenda Peck, MD, MD   Discharge Diagnoses:   Active Hospital Problems  Diagnoses Date Noted   . Colitis 11/13/2011   . Acute gout 11/15/2011   . Diabetes mellitus    . COPD (chronic obstructive pulmonary disease)    . Hypothyroidism    . Hypertension      Resolved Hospital Problems  Diagnoses Date Noted Date Resolved  . Bright red blood per rectum 11/13/2011 11/17/2011  . Hypotension 11/13/2011 11/13/2011  . Acute renal failure 11/13/2011 11/15/2011     DISCHARGE MEDICATION: Current Discharge Medication List    START taking these medications   Details  colchicine 0.6 MG tablet Take 1 tablet (0.6 mg total) by mouth daily. Qty: 15 tablet, Refills: 0    metroNIDAZOLE (FLAGYL) 500 MG tablet Take 1 tablet (500 mg total) by mouth 3 (three) times daily. Qty: 18 tablet, Refills: 0      CONTINUE these medications which have NOT CHANGED   Details  albuterol (PROVENTIL HFA;VENTOLIN HFA) 108 (90 BASE) MCG/ACT inhaler Inhale 2 puffs into the lungs every 6 (six) hours as needed. Shortness of breath    amitriptyline (ELAVIL) 100 MG tablet Take 100 mg by mouth at bedtime.    atenolol (TENORMIN) 100 MG tablet Take 100 mg by mouth daily.    atorvastatin (LIPITOR) 20 MG tablet Take 20 mg by mouth daily.    calcium carbonate (OS-CAL) 600 MG TABS Take 600 mg by mouth daily.    chlorthalidone (HYGROTON) 25 MG tablet Take 25 mg by mouth daily.    diphenhydrAMINE (SOMINEX) 25 MG tablet Take 25 mg by mouth at bedtime as needed. For allergies    glimepiride (AMARYL) 4 MG tablet Take 4 mg by mouth daily before breakfast.    ibuprofen (ADVIL,MOTRIN) 200 MG tablet Take 600 mg by mouth every 6 (six) hours as needed. For fever/pain    insulin NPH-insulin regular (NOVOLIN 70/30) (70-30) 100 UNIT/ML injection Inject 45-55 Units into the skin 2 (two) times daily with a meal. Take 55 units in the morning and 45  units in the evening    levothyroxine (SYNTHROID, LEVOTHROID) 50 MCG tablet Take 50 mcg by mouth daily.    lisinopril (PRINIVIL,ZESTRIL) 10 MG tablet Take 10 mg by mouth daily.    metFORMIN (GLUCOPHAGE) 1000 MG tablet Take 1,000 mg by mouth 2 (two) times daily with a meal.    naproxen sodium (ANAPROX) 220 MG tablet Take 660 mg by mouth 2 (two) times daily as needed. For pain    phenobarbital-belladonna alkaloids (DONNATAL EXTENTABS) 48 MG CR tablet Take 1 tablet by mouth every 6 (six) hours as needed. For pain           Consults: Treatment Team:  Vertell Novak., MD Barrie Folk, MD   SIGNIFICANT DIAGNOSTIC STUDIES:  Ct Abdomen Pelvis W Contrast  11/13/2011  *RADIOLOGY REPORT*  Clinical Data: Rectal bleeding.  Lower abdominal pain.  CT ABDOMEN AND PELVIS WITH CONTRAST  Technique:  Multidetector CT imaging of the abdomen and pelvis was performed following the standard protocol during bolus administration of intravenous contrast.  Contrast: OMNIPAQUE IOHEXOL 300 MG/ML IV SOLN, 40mL OMNIPAQUE IOHEXOL 300 MG/ML IV SOLN  Comparison: None.  Findings: Lung bases appear clear.  Liver demonstrates old granulomatous calcification adjacent to the falciform ligament. Cholecystectomy.  No calcified gallstones.  Atrophy of the pancreas.  Normal renal enhancement and excretion of contrast. Nonobstructing right renal collecting  system calculus in the inferior pole measuring 7 mm.  Abdominal aortic atherosclerosis. The adrenal glands appear normal.  Stomach and proximal small bowel appear normal. Prominent stool burden is present in the right lower quadrant.  The appendix is not identified.  Prominent stool is present in the cecum. There are marked colonic inflammatory changes extending from the distal transverse colon along the descending colon.  There is no perforation.  The inflammatory changes extend through the sigmoid nearly to the rectum.  Uterus and adnexa appear normal.  Urinary bladder  normal. Aortoiliac atherosclerosis.  No aggressive osseous lesions.  Large vertebral body hemangioma at T12.  IMPRESSION: 1.  Colitis extending from the distal transverse colon through the sigmoid.  Differential considerations include inflammatory bowel disease, infectious colitis including C difficile, with ischemia felt unlikely based on distribution.  The visualized visceral vessels demonstrate opacification of contrast. 2.  Cholecystectomy. 3.  7 mm nonobstructing right inferior pole renal collecting system calculus.  Original Report Authenticated By: Andreas Newport, M.D.     ECHO:None     CARDIAC CATH & OTHER PROCEDURES: Colonoscopy by Carman Ching  Impression: COMPLICATIONS: A complication of none occured on 11/14/2011 at.  ENDOSCOPIC IMPRESSION:  1) Moderate diverticulosis in the descending colon  2) Colitis in the descending colon  this was most consistent with ischemic colitis.  Recent Results (from the past 240 hour(s))  STOOL CULTURE     Status: Normal   Collection Time   11/13/11 11:41 PM      Component Value Range Status Comment   Specimen Description STOOL   Final    Special Requests NONE   Final    Culture     Final    Value: NO SALMONELLA, SHIGELLA, CAMPYLOBACTER, OR YERSINIA ISOLATED   Report Status 11/17/2011 FINAL   Final   CLOSTRIDIUM DIFFICILE BY PCR     Status: Normal   Collection Time   11/13/11 11:41 PM      Component Value Range Status Comment   C difficile by pcr NEGATIVE  NEGATIVE  Final     BRIEF ADMITTING H & P: Pt is a 77 y/o with h/o Diverticulosis, DM, HTN, COPD, IBS that presented to the ED c/o BRBPR.  Patient was placed NPO and was started on Metronidazole.  Colonoscopy results as above and patient was treated for colitis.  Bleeding eventually stopped and patient was able to get her diet advanced.  Has had good po intake now and reports no new BRBPR.  H/H have been stable and last one on 11/17/11 read as 10.7/31.4.  Active Hospital Problems    Diagnoses Date Noted   . Colitis 11/13/2011   . Acute gout 11/15/2011   . Diabetes mellitus    . COPD (chronic obstructive pulmonary disease)    . Hypothyroidism    . Hypertension      Resolved Hospital Problems  Diagnoses Date Noted Date Resolved  . Bright red blood per rectum 11/13/2011 11/17/2011  . Hypotension 11/13/2011 11/13/2011  . Acute renal failure 11/13/2011 11/15/2011     Disposition and Follow-up: Pt is to follow-up with her PCP in ~ 1 week and with her GI doctor in 1-2 weeks. Discharge Orders    Future Orders Please Complete By Expires   Diet - low sodium heart healthy      Increase activity slowly      Discharge instructions      Comments:   Pt is to follow up with her Primary care physician in 1-2 weeks.  Call MD for:  persistant nausea and vomiting      Call MD for:  temperature >100.4      Call MD for:  severe uncontrolled pain      Call MD for:  persistant dizziness or light-headedness      Call MD for:  extreme fatigue        Follow-up Information    Follow up with ROBERTS, Vernie Ammons, MD .          DISCHARGE EXAM:   General: Alert, awake, oriented x3, in no acute distress. HEENT: No bruits, no goiter. Heart: Regular rate and rhythm, without murmurs, rubs, gallops. Lungs: Clear to auscultation bilaterally. Abdomen: Soft, nontender, nondistended, positive bowel sounds. Extremities: No clubbing cyanosis or edema with positive pedal pulses. Neuro: Grossly intact, nonfocal.    Blood pressure 148/84, pulse 98, temperature 98.1 F (36.7 C), temperature source Oral, resp. rate 18, height 5\' 2"  (1.575 m), weight 92.3 kg (203 lb 7.8 oz), SpO2 94.00%.  No results found for this basename: NA:2,K:2,CL:2,CO2:2,GLUCOSE:2,BUN:2,CREATININE:2,CALCIUM:2,MG:2,PHOS:2 in the last 72 hours No results found for this basename: AST:2,ALT:2,ALKPHOS:2,BILITOT:2,PROT:2,ALBUMIN:2 in the last 72 hours No results found for this basename: LIPASE:2,AMYLASE:2 in the last  72 hours  Basename 11/17/11 0900 11/16/11 1820  WBC 4.7 4.6  NEUTROABS -- --  HGB 10.7* 10.9*  HCT 31.4* 32.2*  MCV 93.5 92.8  PLT 237 232    Signed: [name] M.D. 11/17/2011, 3:08 PM

## 2011-11-17 NOTE — Progress Notes (Signed)
Discharge instructions reviewed with patient. Home medication list reviewed with patient and teaching done on new medications.  Voices understanding to teaching. Prescriptions and copies of AVS given to patient. To door via wheelchair. Home via  car with her daughter driving.

## 2011-11-17 NOTE — Progress Notes (Signed)
The patient states that she feels fine today. She has a little left lower quadrant discomfort but not much. She is tolerating food. She would like to go home, and feels up to going home today.  Abdomen is soft with just some minimal left lower quadrant discomfort to palpation  Impression probable ischemic colitis biopsies pending  Plan: She appears clinically stable and I think can probably go home and followup with Dr. Randa Evens as an outpatient.

## 2011-11-17 NOTE — Progress Notes (Signed)
Physical Therapy Treatment Patient Details Name: Miranda Erickson MRN: [id number] DOB: 1934-11-12 Today's Date: 11/17/2011  PT Assessment/Plan  PT - Assessment/Plan Comments on Treatment Session: Pt presents with at least Modified Independence for all mobility tested today.  Initially pt was upset stating that she did not need any therapy when I explained why we were seeing her pt was still upset but did not want to decline thearpy "if I dont and something happens you will say it was my fault.".  Pt should be safe for d/c home when cleared by MD. PT Plan: Discharge plan remains appropriate PT Frequency: Min 3X/week Follow Up Recommendations: No PT follow up Equipment Recommended: None recommended by PT PT Goals  Acute Rehab PT Goals PT Goal Formulation: With patient Time For Goal Achievement: 7 days Pt will Transfer Bed to Chair/Chair to Bed: with modified independence PT Transfer Goal: Bed to Chair/Chair to Bed - Progress: Met Pt will Ambulate: 16 - 50 feet;with modified independence PT Goal: Ambulate - Progress: Met Pt will Go Up / Down Stairs: 3-5 stairs;with min assist PT Goal: Up/Down Stairs - Progress: Not met  PT Treatment Precautions/Restrictions  Precautions Precautions: Fall Precaution Comments: contact isolation Required Braces or Orthoses: No Restrictions Weight Bearing Restrictions: No Other Position/Activity Restrictions: Gout in Left foot Mobility (including Balance) Bed Mobility Bed Mobility: Yes Supine to Sit: 7: Independent;HOB elevated (Comment degrees) Sit to Supine: 6: Modified independent (Device/Increase time);HOB flat Transfers Transfers: Yes Sit to Stand: 6: Modified independent (Device/Increase time) Stand Pivot Transfers: 6: Modified independent (Device/Increase time) Ambulation/Gait Ambulation/Gait: Yes Ambulation/Gait Assistance: 6: Modified independent (Device/Increase time) Ambulation Distance (Feet): 150 Feet Assistive device: Rolling  walker Gait Pattern: Decreased stride length Gait velocity: slow Stairs: No Wheelchair Mobility Wheelchair Mobility: No  Posture/Postural Control Posture/Postural Control: No significant limitations Balance Balance Assessed: No Exercise    End of Session PT - End of Session Equipment Utilized During Treatment: Gait belt Activity Tolerance: Patient tolerated treatment well;Patient limited by pain Patient left: in bed;with call bell in reach;with family/visitor present Nurse Communication: Mobility status for transfers;Mobility status for ambulation General Behavior During Session: [facility] for tasks performed Cognition: [facility] for tasks performed  Tegan Burnside 11/17/2011, 3:37 PM Santos Sollenberger L. Sidrah Harden DPT [phone]

## 2011-11-17 NOTE — Progress Notes (Signed)
Inpatient Diabetes Program Recommendations  AACE/ADA: New Consensus Statement on Inpatient Glycemic Control (2009)  Target Ranges:  Prepandial:   less than 140 mg/dL      Peak postprandial:   less than 180 mg/dL (1-2 hours)      Critically ill patients:  140 - 180 mg/dL   Reason for Visit: Hyperglycemia  If patient remains in the hospital, may benefit from regularly scheduled insulin-- either Lantus as basal and Novolog meal coverage or half to three/fourths of pre-admission 70/30 dose with titration as indicated.  Ate 15 to 75% yesterday.  Ate 100% breakfast today.  (Based on prior to admission 70/30 dosages, full Lantus conversion dose would be 56 units daily.  Could consider Lantus 28 to 42 units daily and Novolog 4 to 6 units tid with titration as indicated.)  May also benefit from [facility] modifed medium restriction added to diet order if this is congruent with goals of care.   Note:Results for TERRA, AVENI (MRN [id number]) as of 11/17/2011 12:33  Ref. Range 11/16/2011 08:08 11/16/2011 12:23 11/16/2011 17:13 11/16/2011 22:04 11/17/2011 07:41 11/17/2011 11:50  Glucose-Capillary Latest Range: 70-99 mg/dL 962 (H) 952 (H) 841 (H) 256 (H) 225 (H) 264 (H)

## 2013-01-07 ENCOUNTER — Inpatient Hospital Stay (HOSPITAL_COMMUNITY): Payer: Medicare Other

## 2013-01-07 ENCOUNTER — Encounter (HOSPITAL_COMMUNITY): Payer: Self-pay | Admitting: Emergency Medicine

## 2013-01-07 ENCOUNTER — Inpatient Hospital Stay (HOSPITAL_COMMUNITY)
Admission: EM | Admit: 2013-01-07 | Discharge: 2013-01-11 | DRG: 395 | Disposition: A | Discharge status: Home / Self Care | Payer: Medicare Other | Attending: Internal Medicine | Admitting: Internal Medicine

## 2013-01-07 DIAGNOSIS — J449 Chronic obstructive pulmonary disease, unspecified: Secondary | ICD-10-CM | POA: Diagnosis present

## 2013-01-07 DIAGNOSIS — R0600 Dyspnea, unspecified: Secondary | ICD-10-CM

## 2013-01-07 DIAGNOSIS — I10 Essential (primary) hypertension: Secondary | ICD-10-CM | POA: Diagnosis present

## 2013-01-07 DIAGNOSIS — E118 Type 2 diabetes mellitus with unspecified complications: Secondary | ICD-10-CM

## 2013-01-07 DIAGNOSIS — F17200 Nicotine dependence, unspecified, uncomplicated: Secondary | ICD-10-CM | POA: Diagnosis present

## 2013-01-07 DIAGNOSIS — Z882 Allergy status to sulfonamides status: Secondary | ICD-10-CM

## 2013-01-07 DIAGNOSIS — K922 Gastrointestinal hemorrhage, unspecified: Secondary | ICD-10-CM

## 2013-01-07 DIAGNOSIS — Z79899 Other long term (current) drug therapy: Secondary | ICD-10-CM

## 2013-01-07 DIAGNOSIS — E039 Hypothyroidism, unspecified: Secondary | ICD-10-CM | POA: Diagnosis present

## 2013-01-07 DIAGNOSIS — E785 Hyperlipidemia, unspecified: Secondary | ICD-10-CM | POA: Diagnosis present

## 2013-01-07 DIAGNOSIS — Z888 Allergy status to other drugs, medicaments and biological substances status: Secondary | ICD-10-CM

## 2013-01-07 DIAGNOSIS — J4489 Other specified chronic obstructive pulmonary disease: Secondary | ICD-10-CM | POA: Diagnosis present

## 2013-01-07 DIAGNOSIS — K529 Noninfective gastroenteritis and colitis, unspecified: Secondary | ICD-10-CM | POA: Diagnosis present

## 2013-01-07 DIAGNOSIS — R000 Tachycardia, unspecified: Secondary | ICD-10-CM | POA: Diagnosis present

## 2013-01-07 DIAGNOSIS — E119 Type 2 diabetes mellitus without complications: Secondary | ICD-10-CM | POA: Diagnosis present

## 2013-01-07 DIAGNOSIS — K559 Vascular disorder of intestine, unspecified: Principal | ICD-10-CM | POA: Diagnosis present

## 2013-01-07 DIAGNOSIS — Z794 Long term (current) use of insulin: Secondary | ICD-10-CM

## 2013-01-07 DIAGNOSIS — Z9010 Acquired absence of unspecified breast and nipple: Secondary | ICD-10-CM

## 2013-01-07 DIAGNOSIS — K625 Hemorrhage of anus and rectum: Secondary | ICD-10-CM | POA: Diagnosis present

## 2013-01-07 DIAGNOSIS — N182 Chronic kidney disease, stage 2 (mild): Secondary | ICD-10-CM | POA: Diagnosis present

## 2013-01-07 DIAGNOSIS — Z853 Personal history of malignant neoplasm of breast: Secondary | ICD-10-CM

## 2013-01-07 DIAGNOSIS — I129 Hypertensive chronic kidney disease with stage 1 through stage 4 chronic kidney disease, or unspecified chronic kidney disease: Secondary | ICD-10-CM | POA: Diagnosis present

## 2013-01-07 HISTORY — DX: Heart failure, unspecified: I50.9

## 2013-01-07 HISTORY — DX: Malignant (primary) neoplasm, unspecified: C80.1

## 2013-01-07 LAB — COMPREHENSIVE METABOLIC PANEL
ALT: 21 U/L (ref 0–35)
AST: 27 U/L (ref 0–37)
CO2: 30 mEq/L (ref 19–32)
Calcium: 9.8 mg/dL (ref 8.4–10.5)
GFR calc non Af Amer: 37 mL/min — ABNORMAL LOW (ref >90)
Sodium: 139 mEq/L (ref 135–145)
Total Protein: 7.6 g/dL (ref 6.0–8.3)

## 2013-01-07 LAB — GLUCOSE, CAPILLARY: Glucose-Capillary: 50 mg/dL — ABNORMAL LOW (ref 70–99)

## 2013-01-07 LAB — CBC
HCT: 37.4 % (ref 36.0–46.0)
Hemoglobin: 13.1 g/dL (ref 12.0–15.0)
MCH: 32.2 pg (ref 26.0–34.0)
MCH: 31.6 pg (ref 26.0–34.0)
MCH: 31.1 pg (ref 26.0–34.0)
MCHC: 35 g/dL (ref 30.0–36.0)
MCV: 90.3 fL (ref 78.0–100.0)
MCV: 88.8 fL (ref 78.0–100.0)
Platelets: 223 10*3/uL (ref 150–400)
Platelets: 210 10*3/uL (ref 150–400)
RBC: 4.29 MIL/uL (ref 3.87–5.11)
RDW: 12.8 % (ref 11.5–15.5)

## 2013-01-07 LAB — BASIC METABOLIC PANEL
BUN: 29 mg/dL — ABNORMAL HIGH (ref 6–23)
Calcium: 9 mg/dL (ref 8.4–10.5)
Creatinine, Ser: 1.11 mg/dL — ABNORMAL HIGH (ref 0.50–1.10)
GFR calc Af Amer: 55 mL/min — ABNORMAL LOW (ref >90)
GFR calc non Af Amer: 47 mL/min — ABNORMAL LOW (ref >90)

## 2013-01-07 LAB — PROTIME-INR: Prothrombin Time: 13.6 seconds (ref 11.6–15.2)

## 2013-01-07 MED ORDER — SODIUM CHLORIDE 0.9 % IV SOLN
Freq: Once | INTRAVENOUS | Status: AC
  Administered 2013-01-07: 04:00:00 via INTRAVENOUS

## 2013-01-07 MED ORDER — SODIUM CHLORIDE 0.9 % IV SOLN
INTRAVENOUS | Status: DC
  Administered 2013-01-07 – 2013-01-08 (×2): via INTRAVENOUS

## 2013-01-07 MED ORDER — ONDANSETRON HCL 4 MG/2ML IJ SOLN: 4.0000 mg | Freq: Four times a day (QID) | INTRAMUSCULAR | Status: DC | PRN

## 2013-01-07 MED ORDER — IOHEXOL 300 MG/ML  SOLN
25.0000 mL | INTRAMUSCULAR | Status: AC
  Administered 2013-01-07 (×2): 25 mL via ORAL

## 2013-01-07 MED ORDER — FENTANYL CITRATE 0.05 MG/ML IJ SOLN
50.0000 ug | Freq: Once | INTRAMUSCULAR | Status: AC
  Administered 2013-01-07: 50 ug via INTRAVENOUS
  Filled 2013-01-07: qty 2

## 2013-01-07 MED ORDER — ONDANSETRON HCL 4 MG PO TABS: 4.0000 mg | ORAL_TABLET | Freq: Four times a day (QID) | ORAL | Status: DC | PRN

## 2013-01-07 MED ORDER — MORPHINE SULFATE 2 MG/ML IJ SOLN
1.0000 mg | INTRAMUSCULAR | Status: DC | PRN
  Administered 2013-01-07 – 2013-01-08 (×5): 2 mg via INTRAVENOUS
  Filled 2013-01-07 (×5): qty 1

## 2013-01-07 MED ORDER — DEXTROSE 5 % IV SOLN
1.0000 g | INTRAVENOUS | Status: DC
  Administered 2013-01-07 – 2013-01-08 (×2): 1 g via INTRAVENOUS
  Filled 2013-01-07 (×3): qty 10

## 2013-01-07 MED ORDER — METOPROLOL TARTRATE 1 MG/ML IV SOLN: 10.0000 mg | Freq: Once | INTRAVENOUS | Status: AC

## 2013-01-07 MED ORDER — LEVOTHYROXINE SODIUM 50 MCG PO TABS
50.0000 ug | ORAL_TABLET | Freq: Every day | ORAL | Status: DC
  Administered 2013-01-09 – 2013-01-11 (×3): 50 ug via ORAL
  Filled 2013-01-07 (×5): qty 1

## 2013-01-07 MED ORDER — SODIUM CHLORIDE 0.9 % IV SOLN: INTRAVENOUS | Status: DC

## 2013-01-07 MED ORDER — ACETAMINOPHEN 650 MG RE SUPP: 650.0000 mg | Freq: Four times a day (QID) | RECTAL | Status: DC | PRN

## 2013-01-07 MED ORDER — ACETAMINOPHEN 325 MG PO TABS: 650.0000 mg | ORAL_TABLET | Freq: Four times a day (QID) | ORAL | Status: DC | PRN

## 2013-01-07 MED ORDER — PANTOPRAZOLE SODIUM 40 MG IV SOLR
40.0000 mg | Freq: Every day | INTRAVENOUS | Status: DC
  Administered 2013-01-07: 40 mg via INTRAVENOUS
  Filled 2013-01-07 (×2): qty 40

## 2013-01-07 MED ORDER — IOHEXOL 300 MG/ML  SOLN
100.0000 mL | Freq: Once | INTRAMUSCULAR | Status: AC | PRN
  Administered 2013-01-07: 100 mL via INTRAVENOUS

## 2013-01-07 MED ORDER — ONDANSETRON HCL 4 MG/2ML IJ SOLN: 4.0000 mg | Freq: Three times a day (TID) | INTRAMUSCULAR | Status: DC | PRN

## 2013-01-07 MED ORDER — ALBUTEROL SULFATE HFA 108 (90 BASE) MCG/ACT IN AERS: 2.0000 | INHALATION_SPRAY | Freq: Four times a day (QID) | RESPIRATORY_TRACT | Status: DC | PRN

## 2013-01-07 MED ORDER — METOPROLOL TARTRATE 1 MG/ML IV SOLN
5.0000 mg | Freq: Four times a day (QID) | INTRAVENOUS | Status: DC
  Administered 2013-01-08 (×2): 5 mg via INTRAVENOUS
  Filled 2013-01-07 (×7): qty 5

## 2013-01-07 MED ORDER — METOPROLOL TARTRATE 1 MG/ML IV SOLN
INTRAVENOUS | Status: AC
  Administered 2013-01-07: 10 mg via INTRAVENOUS
  Filled 2013-01-07: qty 5

## 2013-01-07 MED ORDER — IOHEXOL 300 MG/ML  SOLN: 25.0000 mL | INTRAMUSCULAR | Status: DC

## 2013-01-07 MED ORDER — INSULIN ASPART 100 UNIT/ML ~~LOC~~ SOLN
0.0000 [IU] | Freq: Four times a day (QID) | SUBCUTANEOUS | Status: DC
Start: 2013-01-07 — End: 2013-01-08
  Administered 2013-01-08 (×2): 1 [IU] via SUBCUTANEOUS

## 2013-01-07 MED ORDER — INSULIN ASPART 100 UNIT/ML ~~LOC~~ SOLN: 0.0000 [IU] | Freq: Three times a day (TID) | SUBCUTANEOUS | Status: DC

## 2013-01-07 NOTE — ED Provider Notes (Signed)
History     CSN: [id number]  Arrival date & time 01/07/13  0112   First MD Initiated Contact with Patient 01/07/13 0124      Chief Complaint  Patient presents with  . GI Bleeding    (Consider location/radiation/quality/duration/timing/severity/associated sxs/prior treatment) HPI 78 year old female presents to the ER via EMS from home with complaint of lower GI bleeding.  Pt had bloody bowel movement tonight around 11 pm.  She has had 3 epsiodes since that time.  She denies any new weakness, dizziness, shortness of breath, chest pain.  She is having mild abdominal cramping.  Pt with past history of lower GI bleed about a year ago, reports the GI doctor told her "I had a stroke in my intestines".  No problems since that time.  She takes baby aspirin.   Past Medical History  Diagnosis Date  . Diverticulosis 11/2003    Seen on colonoscopy  . Diabetes mellitus   . Hypertension   . COPD (chronic obstructive pulmonary disease)   . 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 Mitral valve prolapse   . Hypothyroidism   . Complication of anesthesia     demerol, vomiting  . Breast cancer 1979    S/p bilateral mastectomy and reconstruction    Past Surgical History  Procedure Laterality Date  . Ulnar nerve repair      Decompression of ulnar and median nerve at carpal tunnel   . Mastectomy      bilateral, s/p reconstruction  . Appendectomy    . Cholecystectomy    . Tonsillectomy    . Colonoscopy  11/14/2011    Procedure: COLONOSCOPY;  Surgeon: Vertell Novak., MD;  Location: [facility] ENDOSCOPY;  Service: Endoscopy;  Laterality: N/A;    Family History  Problem Relation Age of Onset  . Breast cancer Mother     Deceased at 20  . Parkinsonism Father     History  Substance Use Topics  . Smoking status: Current Every Day Smoker -- 1.50 packs/day for 15 years    Types: Cigarettes  . Smokeless tobacco: Never Used  . Alcohol Use: No    OB  History   Grav Para Term Preterm Abortions TAB SAB Ect Mult Living                  Review of Systems  All other systems reviewed and are negative.   See History of Present Illness; otherwise all other systems are reviewed and negative  Allergies  Demerol and Sulfa antibiotics  Home Medications   Current Outpatient Rx  Name  Route  Sig  Dispense  Refill  . albuterol (PROVENTIL HFA;VENTOLIN HFA) 108 (90 BASE) MCG/ACT inhaler   Inhalation   Inhale 2 puffs into the lungs every 6 (six) hours as needed. Shortness of breath         . amitriptyline (ELAVIL) 100 MG tablet   Oral   Take 100 mg by mouth at bedtime.         Marland Kitchen atenolol (TENORMIN) 100 MG tablet   Oral   Take 100 mg by mouth daily.         Marland Kitchen atorvastatin (LIPITOR) 20 MG tablet   Oral   Take 20 mg by mouth daily.         . calcium carbonate (OS-CAL) 600 MG TABS   Oral   Take 600 mg by mouth daily.         Marland Kitchen  chlorthalidone (HYGROTON) 25 MG tablet   Oral   Take 25 mg by mouth daily.         Marland Kitchen ibuprofen (ADVIL,MOTRIN) 200 MG tablet   Oral   Take 600 mg by mouth every 6 (six) hours as needed. For fever/pain         . insulin NPH-insulin regular (NOVOLIN 70/30) (70-30) 100 UNIT/ML injection   Subcutaneous   Inject 55 Units into the skin daily with breakfast. Take 55 units in the morning and 45 units in the evening         . levothyroxine (SYNTHROID, LEVOTHROID) 50 MCG tablet   Oral   Take 50 mcg by mouth daily.         Marland Kitchen lisinopril (PRINIVIL,ZESTRIL) 10 MG tablet   Oral   Take 20 mg by mouth daily.          . metFORMIN (GLUCOPHAGE) 1000 MG tablet   Oral   Take 1,000 mg by mouth 2 (two) times daily with a meal.         . EXPIRED: colchicine 0.6 MG tablet   Oral   Take 1 tablet (0.6 mg total) by mouth daily.   15 tablet   0     BP 112/58  Pulse 78  Temp(Src) 98.4 F (36.9 C) (Oral)  Resp 17  SpO2 97%  Physical Exam  Nursing note and vitals reviewed. Constitutional: She is  oriented to person, place, and time. She appears well-developed and well-nourished.  HENT:  Head: Normocephalic and atraumatic.  Nose: Nose normal.  Mouth/Throat: Oropharynx is clear and moist.  Eyes: Conjunctivae and EOM are normal. Pupils are equal, round, and reactive to light.  Neck: Normal range of motion. Neck supple. No JVD present. No tracheal deviation present. No thyromegaly present.  Cardiovascular: Normal rate, regular rhythm, normal heart sounds and intact distal pulses.  Exam reveals no gallop and no friction rub.   No murmur heard. Pulmonary/Chest: Effort normal and breath sounds normal. No stridor. No respiratory distress. She has no wheezes. She has no rales. She exhibits no tenderness.  Abdominal: Soft. She exhibits no distension and no mass. There is tenderness (mild diffuse tenderness). There is no rebound and no guarding.  Hyperactive bowel sounds  Genitourinary:  Patient has had bloody bowel movement here in the department  Musculoskeletal: Normal range of motion. She exhibits no edema and no tenderness.  Lymphadenopathy:    She has no cervical adenopathy.  Neurological: She is alert and oriented to person, place, and time. No cranial nerve deficit. She exhibits normal muscle tone. Coordination normal.  Skin: Skin is warm and dry. No rash noted. No erythema. No pallor.  Psychiatric: She has a normal mood and affect. Her behavior is normal. Judgment and thought content normal.    ED Course  Procedures (including critical care time)  Labs Reviewed  COMPREHENSIVE METABOLIC PANEL - Abnormal; Notable for the following:    BUN 32 (*)    Creatinine, Ser 1.37 (*)    Total Bilirubin 0.2 (*)    GFR calc non Af Amer 37 (*)    GFR calc Af Amer 43 (*)    All other components within normal limits  PROTIME-INR  CBC  SAMPLE TO BLOOD BANK   No results found.     1. Lower GI bleed       MDM  78 yo female with lower GI bleed, h/o same.  Pt with stable vitals, h/h  normal.  Plan for admission and  GI consult.         Olivia Mackie, MD 01/07/13 [phone]

## 2013-01-07 NOTE — ED Notes (Signed)
Pt had bowel movement with small amount of stool, and large amounts of blood.

## 2013-01-07 NOTE — Progress Notes (Signed)
1330 Report called to receiving nurse [id number]. Past medical history and present hospitalization progression reported. All questions answered at this time.  Daughter made aware of transfer and room number Miranda Erickson [phone]).  All belongings transferred with patient via bed.

## 2013-01-07 NOTE — H&P (Addendum)
[facility] History and Physical  PECOLA Erickson JXB:[id number] DOB: 1935-09-03 DOA: 01/07/2013  Referring physician: Dr.Otter. PCP: Lorenda Peck, MD  Specialists: None.  Chief Complaint: Rectal bleeding.  HPI: Miranda Erickson is a 78 y.o. female with known history of diabetes mellitus type 2, hypertension and hyperlipidemia who has had rectal bleeding last year and that time colonoscopy was done and showing features of ischemic colitis presents with complaints of having rectal bleeding since last evening. Patient states that she has irritable bowel syndrome and over the last one week she has had at least 2 episodes of diarrhea with abdominal discomfort. But last evening her abdominal discomfort became more intense with 4-5 episodes of rectal bleeding which was progressively worsening with patient experiencing weakness. Denies any nausea vomiting or any fever chills. Patient has not used any recent antibiotics. Patient does use ibuprofen for pain relief. In the ER patient's hemoglobin is found to be around 13 patient at this time is hemodynamically stable but feels weak on standing. Patient will be admitted for further management. Patient denies any chest pain or shortness of breath.  Review of Systems: As presented in the history of presenting illness rest all negative.  Past Medical History  Diagnosis Date  . Diverticulosis 11/2003    Seen on colonoscopy  . Diabetes mellitus   . Hypertension   . COPD (chronic obstructive pulmonary disease)   . 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 Mitral valve prolapse   . Hypothyroidism   . Complication of anesthesia     demerol, vomiting  . Breast cancer 1979    S/p bilateral mastectomy and reconstruction   Past Surgical History  Procedure Laterality Date  . Ulnar nerve repair      Decompression of ulnar and median nerve at carpal tunnel   . Mastectomy      bilateral, s/p  reconstruction  . Appendectomy    . Cholecystectomy    . Tonsillectomy    . Colonoscopy  11/14/2011    Procedure: COLONOSCOPY;  Surgeon: Vertell Novak., MD;  Location: [facility] ENDOSCOPY;  Service: Endoscopy;  Laterality: N/A;   Social History:  reports that she has been smoking Cigarettes.  She has a 22.5 pack-year smoking history. She has never used smokeless tobacco. She reports that she does not drink alcohol or use illicit drugs. Lives at home. where does patient live--home, ALF, SNF? and with whom if at home? Can do ADLs. Can patient participate in ADLs?  Allergies  Allergen Reactions  . Demerol Nausea And Vomiting  . Sulfa Antibiotics Hives    Family History  Problem Relation Age of Onset  . Breast cancer Mother     Deceased at 86  . Parkinsonism Father       Prior to Admission medications   Medication Sig Start Date End Date Taking? Authorizing Provider  albuterol (PROVENTIL HFA;VENTOLIN HFA) 108 (90 BASE) MCG/ACT inhaler Inhale 2 puffs into the lungs every 6 (six) hours as needed. Shortness of breath   Yes Historical Provider, MD  amitriptyline (ELAVIL) 100 MG tablet Take 100 mg by mouth at bedtime.   Yes Historical Provider, MD  atenolol (TENORMIN) 100 MG tablet Take 100 mg by mouth daily.   Yes Historical Provider, MD  atorvastatin (LIPITOR) 20 MG tablet Take 20 mg by mouth daily.   Yes Historical Provider, MD  calcium carbonate (OS-CAL) 600 MG TABS Take 600 mg by mouth daily.  Yes Historical Provider, MD  chlorthalidone (HYGROTON) 25 MG tablet Take 25 mg by mouth daily.   Yes Historical Provider, MD  ibuprofen (ADVIL,MOTRIN) 200 MG tablet Take 600 mg by mouth every 6 (six) hours as needed. For fever/pain   Yes Historical Provider, MD  insulin NPH-insulin regular (NOVOLIN 70/30) (70-30) 100 UNIT/ML injection Inject 55 Units into the skin daily with breakfast. Take 55 units in the morning and 45 units in the evening   Yes Historical Provider, MD  levothyroxine (SYNTHROID,  LEVOTHROID) 50 MCG tablet Take 50 mcg by mouth daily.   Yes Historical Provider, MD  lisinopril (PRINIVIL,ZESTRIL) 10 MG tablet Take 20 mg by mouth daily.    Yes Historical Provider, MD  metFORMIN (GLUCOPHAGE) 1000 MG tablet Take 1,000 mg by mouth 2 (two) times daily with a meal.   Yes Historical Provider, MD  colchicine 0.6 MG tablet Take 1 tablet (0.6 mg total) by mouth daily. 11/17/11 11/16/12  Miranda Pia, MD   Physical Exam: Filed Vitals:   01/07/13 0225 01/07/13 0229 01/07/13 0232 01/07/13 0333  BP: 110/62 108/64 112/58 119/51  Pulse: 72 84 78 66  Temp:    98.4 F (36.9 C)  TempSrc:    Oral  Resp:    20  SpO2:   97% 97%     General:  Well-developed and nourished.  Eyes: Anicteric no pallor.  ENT: No discharge from the ears eyes nose and mouth.  Neck: No mass felt.  Cardiovascular: S1-S2 heard.  Respiratory: No rhonchi or crepitations.  Abdomen: Soft nontender bowel sounds present.  Skin: No rash seen.  Musculoskeletal: No edema or effusions.  Psychiatric: Appears normal.  Neurologic: Alert awake oriented to time place and person. Moves all extremities.  Labs on Admission:  Basic Metabolic Panel:  Recent Labs Lab 01/07/13 0139  NA 139  K 4.5  CL 97  CO2 30  GLUCOSE 73  BUN 32*  CREATININE 1.37*  CALCIUM 9.8   Liver Function Tests:  Recent Labs Lab 01/07/13 0139  AST 27  ALT 21  ALKPHOS 78  BILITOT 0.2*  PROT 7.6  ALBUMIN 4.0   No results found for this basename: LIPASE, AMYLASE,  in the last 168 hours No results found for this basename: AMMONIA,  in the last 168 hours CBC:  Recent Labs Lab 01/07/13 0139  WBC 10.0  HGB 13.8  HCT 39.2  MCV 91.4  PLT 223   Cardiac Enzymes: No results found for this basename: CKTOTAL, CKMB, CKMBINDEX, TROPONINI,  in the last 168 hours  BNP (last 3 results) No results found for this basename: PROBNP,  in the last 8760 hours CBG: No results found for this basename: GLUCAP,  in the last 168  hours  Radiological Exams on Admission: No results found.   Assessment/Plan Principal Problem:   Rectal bleeding Active Problems:   Diabetes mellitus   COPD (chronic obstructive pulmonary disease)   Hypertension   1. Rectal bleeding with history of ischemic colitis - patient's source of bleeding could be most likely lower GI. But given the fact that patient is also taking NSAIDs I have placed patient on Protonix IV for now. Kept n.p.o. in anticipation of procedure. Continue hydration. Check CBC now and every 4 hourly. Type and screen and transfuse as needed. Check lactic acid levels. Consult GI. 2. Hypertension - presently holding off antihypertensives. Closely monitor vital signs. 3. Diabetes mellitus type 2 - since patient is n.p.o. I have placed patient on sliding-scale coverage. 4. Hyperlipidemia -  continue home medications once patient starts taking orally. 5. Hypothyroidism - continue Synthroid when patient can take orally or else we need to change it to IV.  Code Status: Full code. Family Communication: Discussed with patient started at the bedside.  Disposition Plan: Admit to inpatient.   KAKRAKANDY,ARSHAD N. [facility] Pager [phone].  If 7PM-7AM, please contact night-coverage [email or web address] Password [facility] 01/07/2013, 3:55 AM Addendum - I have consulted gastroenterologist Dr. Bosie Clos for further recommendations.  Midge Minium.

## 2013-01-07 NOTE — Progress Notes (Signed)
Patient transferred from 2100. She is Alert and oriented to the room. Place on Tele running NSR. Skin intact.

## 2013-01-07 NOTE — Progress Notes (Signed)
Patient's went into A-fib heart rate in the 130's. Dr. Sharon Seller notified and orders received.

## 2013-01-07 NOTE — Consult Note (Signed)
[facility] Consult Note  Referring Provider: No ref. provider found Primary Care Physician:  Lorenda Peck, MD Primary Gastroenterologist:  Dr.  Antony Contras Complaint: Abdominal pain and rectal bleeding HPI: Miranda Erickson is an 78 y.o. white female  who presents with a one-day history of diffuse lower abdominal pain and cramping with bloody stools. The patient had a bout of confirmed ischemic colitis in January 2013 but CT scan and colonoscopy suggesting that etiology. She has done fine since as far as her bowel movements until yesterday. She states her pain was fairly severe initially and is only minimally improved but does respond morphine. She also had diverticulosis on previous colonoscopy.  Past Medical History  Diagnosis Date  . Diverticulosis 11/2003    Seen on colonoscopy  . Diabetes mellitus   . Hypertension   . COPD (chronic obstructive pulmonary disease)   . 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 Mitral valve prolapse   . Hypothyroidism   . Complication of anesthesia     demerol, vomiting  . Breast cancer 1979    S/p bilateral mastectomy and reconstruction    Past Surgical History  Procedure Laterality Date  . Ulnar nerve repair      Decompression of ulnar and median nerve at carpal tunnel   . Mastectomy      bilateral, s/p reconstruction  . Appendectomy    . Cholecystectomy    . Tonsillectomy    . Colonoscopy  11/14/2011    Procedure: COLONOSCOPY;  Surgeon: Vertell Novak., MD;  Location: [facility] ENDOSCOPY;  Service: Endoscopy;  Laterality: N/A;    Medications Prior to Admission  Medication Sig Dispense Refill  . albuterol (PROVENTIL HFA;VENTOLIN HFA) 108 (90 BASE) MCG/ACT inhaler Inhale 2 puffs into the lungs every 6 (six) hours as needed. Shortness of breath      . amitriptyline (ELAVIL) 100 MG tablet Take 100 mg by mouth at bedtime.      Marland Kitchen atenolol (TENORMIN) 100 MG tablet Take 100 mg by mouth  daily.      Marland Kitchen atorvastatin (LIPITOR) 20 MG tablet Take 20 mg by mouth daily.      . calcium carbonate (OS-CAL) 600 MG TABS Take 600 mg by mouth daily.      . chlorthalidone (HYGROTON) 25 MG tablet Take 25 mg by mouth daily.      Marland Kitchen ibuprofen (ADVIL,MOTRIN) 200 MG tablet Take 600 mg by mouth every 6 (six) hours as needed. For fever/pain      . insulin NPH-insulin regular (NOVOLIN 70/30) (70-30) 100 UNIT/ML injection Inject 55 Units into the skin daily with breakfast. Take 55 units in the morning and 45 units in the evening      . levothyroxine (SYNTHROID, LEVOTHROID) 50 MCG tablet Take 50 mcg by mouth daily.      Marland Kitchen lisinopril (PRINIVIL,ZESTRIL) 10 MG tablet Take 20 mg by mouth daily.       . metFORMIN (GLUCOPHAGE) 1000 MG tablet Take 1,000 mg by mouth 2 (two) times daily with a meal.      . colchicine 0.6 MG tablet Take 1 tablet (0.6 mg total) by mouth daily.  15 tablet  0    Allergies:  Allergies  Allergen Reactions  . Demerol Nausea And Vomiting  . Sulfa Antibiotics Hives    Family History  Problem Relation Age of Onset  . Breast cancer Mother     Deceased at 55  . Parkinsonism  Father     Social History:  reports that she has been smoking Cigarettes.  She has a 22.5 pack-year smoking history. She has never used smokeless tobacco. She reports that she does not drink alcohol or use illicit drugs.  Review of Systems: negative except as above   Blood pressure 132/59, pulse 91, temperature 99 F (37.2 C), temperature source Oral, resp. rate 19, height 5\' 2"  (1.575 m), weight 85.276 kg (188 lb), SpO2 96.00%. Head: Normocephalic, without obvious abnormality, atraumatic Neck: no adenopathy, no carotid bruit, no JVD, supple, symmetrical, trachea midline and thyroid not enlarged, symmetric, no tenderness/mass/nodules Resp: clear to auscultation bilaterally Cardio: regular rate and rhythm, S1, S2 normal, no murmur, click, rub or gallop GI: Abdomen moderately to markedly tender in the left  lower quadrant suprapubic area but with no rebound. Bowel sounds are active. Extremities: extremities normal, atraumatic, no cyanosis or edema  Results for orders placed during the hospital encounter of 01/07/13 (from the past 48 hour(s))  SAMPLE TO BLOOD BANK     Status: None   Collection Time    01/07/13  1:38 AM      Result Value Range   Blood Bank Specimen SAMPLE AVAILABLE FOR TESTING     Sample Expiration 01/08/2013    PROTIME-INR     Status: None   Collection Time    01/07/13  1:39 AM      Result Value Range   Prothrombin Time 13.6  11.6 - 15.2 seconds   INR 1.05  0.00 - 1.49  CBC     Status: None   Collection Time    01/07/13  1:39 AM      Result Value Range   WBC 10.0  4.0 - 10.5 K/uL   RBC 4.29  3.87 - 5.11 MIL/uL   Hemoglobin 13.8  12.0 - 15.0 g/dL   HCT 09.8  11.9 - 14.7 %   MCV 91.4  78.0 - 100.0 fL   MCH 32.2  26.0 - 34.0 pg   MCHC 35.2  30.0 - 36.0 g/dL   RDW 82.9  56.2 - 13.0 %   Platelets 223  150 - 400 K/uL  COMPREHENSIVE METABOLIC PANEL     Status: Abnormal   Collection Time    01/07/13  1:39 AM      Result Value Range   Sodium 139  135 - 145 mEq/L   Potassium 4.5  3.5 - 5.1 mEq/L   Chloride 97  96 - 112 mEq/L   CO2 30  19 - 32 mEq/L   Glucose, Bld 73  70 - 99 mg/dL   BUN 32 (*) 6 - 23 mg/dL   Creatinine, Ser 8.65 (*) 0.50 - 1.10 mg/dL   Calcium 9.8  8.4 - 78.4 mg/dL   Total Protein 7.6  6.0 - 8.3 g/dL   Albumin 4.0  3.5 - 5.2 g/dL   AST 27  0 - 37 U/L   ALT 21  0 - 35 U/L   Alkaline Phosphatase 78  39 - 117 U/L   Total Bilirubin 0.2 (*) 0.3 - 1.2 mg/dL   GFR calc non Af Amer 37 (*) >90 mL/min   GFR calc Af Amer 43 (*) >90 mL/min   Comment:            The eGFR has been calculated     using the CKD EPI equation.     This calculation has not been     validated in all clinical  situations.     eGFR's persistently     <90 mL/min signify     possible Chronic Kidney Disease.  GLUCOSE, CAPILLARY     Status: Abnormal   Collection Time     01/07/13  4:42 AM      Result Value Range   Glucose-Capillary 50 (*) 70 - 99 mg/dL   Comment 1 Documented in Chart     Comment 2 Notify RN    MRSA PCR SCREENING     Status: None   Collection Time    01/07/13  4:47 AM      Result Value Range   MRSA by PCR NEGATIVE  NEGATIVE   Comment:            The GeneXpert MRSA Assay (FDA     approved for NASAL specimens     only), is one component of a     comprehensive MRSA colonization     surveillance program. It is not     intended to diagnose MRSA     infection nor to guide or     monitor treatment for     MRSA infections.  CBC     Status: None   Collection Time    01/07/13  5:50 AM      Result Value Range   WBC 9.1  4.0 - 10.5 K/uL   RBC 4.14  3.87 - 5.11 MIL/uL   Hemoglobin 13.1  12.0 - 15.0 g/dL   HCT 40.9  81.1 - 91.4 %   MCV 90.3  78.0 - 100.0 fL   MCH 31.6  26.0 - 34.0 pg   MCHC 35.0  30.0 - 36.0 g/dL   RDW 78.2  95.6 - 21.3 %   Platelets 210  150 - 400 K/uL  LACTIC ACID, PLASMA     Status: None   Collection Time    01/07/13  5:50 AM      Result Value Range   Lactic Acid, Venous 1.5  0.5 - 2.2 mmol/L  BASIC METABOLIC PANEL     Status: Abnormal   Collection Time    01/07/13  5:50 AM      Result Value Range   Sodium 138  135 - 145 mEq/L   Potassium 4.4  3.5 - 5.1 mEq/L   Comment: HEMOLYZED SPECIMEN, RESULTS MAY BE AFFECTED   Chloride 101  96 - 112 mEq/L   CO2 27  19 - 32 mEq/L   Glucose, Bld 129 (*) 70 - 99 mg/dL   BUN 29 (*) 6 - 23 mg/dL   Creatinine, Ser 0.86 (*) 0.50 - 1.10 mg/dL   Calcium 9.0  8.4 - 57.8 mg/dL   GFR calc non Af Amer 47 (*) >90 mL/min   GFR calc Af Amer 55 (*) >90 mL/min   Comment:            The eGFR has been calculated     using the CKD EPI equation.     This calculation has not been     validated in all clinical     situations.     eGFR's persistently     <90 mL/min signify     possible Chronic Kidney Disease.  GLUCOSE, CAPILLARY     Status: None   Collection Time    01/07/13  7:40 AM       Result Value Range   Glucose-Capillary 80  70 - 99 mg/dL   No results found.  Assessment: Suspected recurrent ischemic colitis in a  patient who is had confirmed intestinal ischemia the last year, currently with normal white blood cell count relatively benign abdominal exam and stable vital signs. Plan:  IV antibiotics and bowel rest. Will repeat abdominal CT scan with contrast. We'll follow with you. Lajuan Kovaleski C 01/07/2013, 8:36 AM

## 2013-01-07 NOTE — ED Notes (Signed)
Per EMS, pt thought she had to have a bowel movement at 2200 tonight, and had a significant amount of blood instead. Pt has had 3 instances of bleeding tonight.  Pt has hx of GI bleed last year that required cauterization. Pt takes 81mg  asa per day. Vitals stable. Pt is in a-fib, NOT a new onset. Pt states that she is having more pain, and thinks she is about to have another episode of bleeding. Pt comes from home, a/o x4.

## 2013-01-07 NOTE — ED Notes (Signed)
MD at bedside. 

## 2013-01-07 NOTE — Care Management Note (Signed)
    Page 1 of 1   01/11/2013     3:56:15 PM   CARE MANAGEMENT NOTE 01/11/2013  Patient:  Miranda Erickson, Miranda Erickson   Account Number:  [id number]  Date Initiated:  01/07/2013  Documentation initiated by:  Avie Arenas  Subjective/Objective Assessment:   GIB     Action/Plan:   Anticipated DC Date:  01/11/2013   Anticipated DC Plan:  HOME/SELF CARE      DC Planning Services  CM consult      Choice offered to / List presented to:             Status of service:  Completed, signed off Medicare Important Message given?   (If response is "NO", the following Medicare IM given date fields will be blank) Date Medicare IM given:   Date Additional Medicare IM given:    Discharge Disposition:  HOME/SELF CARE  Per UR Regulation:  Reviewed for med. necessity/level of care/duration of stay  If discussed at Long Length of Stay Meetings, dates discussed:    Comments:  ContactLenox Ponds Daughter [phone]  01/11/13 15:55 [name] RN, BSN [phone] patient dc to home, no needs anticipated.  01-07-13 12:15pm Avie Arenas, RNBSN [phone] Talked with daughter and patient in room.  Patient lives at home alone.  Is independent prior to admission - walks with a cane.  At this time feels has no other needs at home.  CM will continue to follow for any further needs.

## 2013-01-07 NOTE — Progress Notes (Signed)
[facility] Progress Note [place] TEAM 1 - Stepdown/ICU TEAM   Miranda Erickson ZOX:[id number] DOB: 11-27-1934 DOA: 01/07/2013 PCP: Lorenda Peck, MD  Brief narrative: 78 year old female with known type 2 diabetes and chronic kidney disease. Had episodic rectal bleeding last year. Underwent colonoscopy which revealed likely ischemic colitis. Patient returned to the hospital with complaints of abrupt onset of crampy colicky lower abdominal pain associated with rectal bleeding. She also endorsed history of irritable bowel syndrome and over the past week having 2 episodes of nonbloody diarrhea associated with abdominal pain. Prior to presenting to the hospital she had severe abdominal pain with 4-5 episodes of rectal bleeding associated with weakness. Patient does use ibuprofen for pain relief. In the emergency department her hemoglobin was stable at 13 - her blood pressure was stable - the patient did endorse feeling weak upon standing.  Assessment/Plan:  Hx of ischemic Colitis / Current rectal bleeding c/w repeat episode -GI evaluation (Dr. Bosie Clos) pending -no bleeding this am but still w/ occ.colicky abd pain -Hgb stable  Diabetes mellitus type 2, controlled, with complications -CBG controlled  -continue SSI -was on Metformin at home - watch closely given underlying CKD  Hypertension -BP controlled; soft -Home Tenormin and Prinivil on hold -Watch for tachycardia 2/2 B blocker w/d  CKD (chronic kidney disease) stage 2, GFR 60-89 ml/min -baseline Scr 0.83- subtle increase since admission; suspect due to recent diarrhea -continue IVF and hold Metformin and ACE I -Follow lytes  COPD (chronic obstructive pulmonary disease) -Compensated w/o wheezing  Hypothyroidism -Cont. Synthroid  DVT prophylaxis: SCDs Code Status: Full Family Communication: Patient and daughter Disposition Plan: Transfer to  telemetry  Consultants: Gastroenterology  Procedures: None  Antibiotics: None  HPI/Subjective: Endorsed having some intermittent colicky abdominal pain but much less than prior to admission and improved with use of IV morphine. No other complaints verbalized  Objective: Blood pressure 98/68, pulse 89, temperature 99 F (37.2 C), temperature source Oral, resp. rate 25, height 5\' 2"  (1.575 m), weight 85.276 kg (188 lb), SpO2 93.00%.  Intake/Output Summary (Last 24 hours) at 01/07/13 1139 Last data filed at 01/07/13 1100  Gross per 24 hour  Intake   1700 ml  Output      0 ml  Net   1700 ml     Exam: Followup exam completed - admitted overnight 01/07/2013 at 3:55 AM  Data Reviewed: Basic Metabolic Panel:  Recent Labs Lab 01/07/13 0139 01/07/13 0550  NA 139 138  K 4.5 4.4  CL 97 101  CO2 30 27  GLUCOSE 73 129*  BUN 32* 29*  CREATININE 1.37* 1.11*  CALCIUM 9.8 9.0   Liver Function Tests:  Recent Labs Lab 01/07/13 0139  AST 27  ALT 21  ALKPHOS 78  BILITOT 0.2*  PROT 7.6  ALBUMIN 4.0   CBC:  Recent Labs Lab 01/07/13 0139 01/07/13 0550  WBC 10.0 9.1  HGB 13.8 13.1  HCT 39.2 37.4  MCV 91.4 90.3  PLT 223 210   CBG:  Recent Labs Lab 01/07/13 0442 01/07/13 0740  GLUCAP 50* 80    Recent Results (from the past 240 hour(s))  MRSA PCR SCREENING     Status: None   Collection Time    01/07/13  4:47 AM      Result Value Range Status   MRSA by PCR NEGATIVE  NEGATIVE Final   Comment:            The GeneXpert MRSA Assay (FDA     approved  for NASAL specimens     only), is one component of a     comprehensive MRSA colonization     surveillance program. It is not     intended to diagnose MRSA     infection nor to guide or     monitor treatment for     MRSA infections.     Studies:  Recent x-ray studies have been reviewed in detail by the Attending Physician  Scheduled Meds:  Reviewed in detail by the Attending Physician   Junious Silk,  ANP [facility] Office  [phone] Pager [phone]  On-Call/Text Page:      [email or web address]      password [email or web address]  If 7PM-7AM, please contact night-coverage [email or web address] Password [email or web address] 01/07/2013, 11:39 AM   LOS: 0 days   I have personally examined this patient and reviewed the entire database. I have reviewed the above note, made any necessary editorial changes, and agree with its content.  Lonia Blood, MD [facility]

## 2013-01-07 NOTE — Progress Notes (Signed)
Utilization review completed.  P.J. Casimira Sutphin,RN,BSN Case Manager [phone]  

## 2013-01-08 DIAGNOSIS — N182 Chronic kidney disease, stage 2 (mild): Secondary | ICD-10-CM

## 2013-01-08 DIAGNOSIS — K922 Gastrointestinal hemorrhage, unspecified: Secondary | ICD-10-CM

## 2013-01-08 LAB — BASIC METABOLIC PANEL
BUN: 14 mg/dL (ref 6–23)
Chloride: 100 mEq/L (ref 96–112)
Creatinine, Ser: 0.97 mg/dL (ref 0.50–1.10)
GFR calc Af Amer: 65 mL/min — ABNORMAL LOW (ref >90)
GFR calc non Af Amer: 56 mL/min — ABNORMAL LOW (ref >90)

## 2013-01-08 LAB — GLUCOSE, CAPILLARY
Glucose-Capillary: 100 mg/dL — ABNORMAL HIGH (ref 70–99)
Glucose-Capillary: 140 mg/dL — ABNORMAL HIGH (ref 70–99)
Glucose-Capillary: 143 mg/dL — ABNORMAL HIGH (ref 70–99)

## 2013-01-08 LAB — CBC
HCT: 35.3 % — ABNORMAL LOW (ref 36.0–46.0)
MCHC: 33.7 g/dL (ref 30.0–36.0)
MCV: 92.2 fL (ref 78.0–100.0)
RDW: 13.1 % (ref 11.5–15.5)

## 2013-01-08 MED ORDER — METOPROLOL TARTRATE 1 MG/ML IV SOLN: 5.0000 mg | Freq: Four times a day (QID) | INTRAVENOUS | Status: DC | PRN

## 2013-01-08 MED ORDER — INSULIN ASPART 100 UNIT/ML ~~LOC~~ SOLN
0.0000 [IU] | Freq: Three times a day (TID) | SUBCUTANEOUS | Status: DC
  Administered 2013-01-09: 1 [IU] via SUBCUTANEOUS
  Administered 2013-01-10: 2 [IU] via SUBCUTANEOUS
  Administered 2013-01-10: 1 [IU] via SUBCUTANEOUS
  Administered 2013-01-10: 5 [IU] via SUBCUTANEOUS
  Administered 2013-01-11: 3 [IU] via SUBCUTANEOUS
  Administered 2013-01-11: 1 [IU] via SUBCUTANEOUS

## 2013-01-08 MED ORDER — METOPROLOL TARTRATE 100 MG PO TABS
100.0000 mg | ORAL_TABLET | Freq: Two times a day (BID) | ORAL | Status: DC
  Administered 2013-01-08 – 2013-01-11 (×7): 100 mg via ORAL
  Filled 2013-01-08 (×8): qty 1

## 2013-01-08 MED ORDER — MORPHINE SULFATE 2 MG/ML IJ SOLN: 1.0000 mg | INTRAMUSCULAR | Status: DC | PRN

## 2013-01-08 MED ORDER — SODIUM CHLORIDE 0.9 % IV BOLUS (SEPSIS)
250.0000 mL | Freq: Once | INTRAVENOUS | Status: AC
  Administered 2013-01-08: 250 mL via INTRAVENOUS

## 2013-01-08 MED ORDER — SODIUM CHLORIDE 0.9 % IV SOLN
INTRAVENOUS | Status: AC
  Administered 2013-01-08 (×2): via INTRAVENOUS

## 2013-01-08 MED ORDER — METOPROLOL TARTRATE 1 MG/ML IV SOLN: 5.0000 mg | INTRAVENOUS | Status: DC | PRN

## 2013-01-08 NOTE — Progress Notes (Signed)
[facility] Progress Note Miranda Erickson TEAM 1 - Stepdown/ICU TEAM   WYNETTA Erickson AVW:[id number] DOB: 12/28/1934 DOA: 01/07/2013 PCP: Lorenda Peck, MD  Brief narrative: 78 year old female with known type 2 diabetes and chronic kidney disease. Had episodic rectal bleeding last year. Underwent colonoscopy which revealed likely ischemic colitis. Patient returned to the hospital with complaints of abrupt onset of crampy colicky lower abdominal pain associated with rectal bleeding. She also endorsed history of irritable bowel syndrome and over the past week having 2 episodes of nonbloody diarrhea associated with abdominal pain. Prior to presenting to the hospital she had severe abdominal pain with 4-5 episodes of rectal bleeding associated with weakness. Patient did use ibuprofen for pain relief. In the emergency department her hemoglobin was stable at 13 - her blood pressure was stable - the patient did endorse feeling weak upon standing.  Assessment/Plan:  Hx of ischemic Colitis / Current rectal bleeding c/w repeat episode -GI following - no plans for procedure at this time  -bleeding appears to have stopped, but intermittent pain persists -Hgb has dropped slightly - will cont to follow trend  Tachycardia - ?parox afib v/s sinus tachy  -pt has hx of MVP and is on chronic BB wch had to be held at admit due to hypotension  -BB withdrawal may be playing a role  -resumed oral BB w/ prn IV BB -cont to follow on tele  -clearly not a candidate for anticoag at this time even if has true afib  Diabetes mellitus type 2, controlled -CBG controlled  -continue SSI -was on Metformin at home  Hypertension -BP controlled; soft -Home Prinivil on hold  CKD (chronic kidney disease) stage 2, GFR 60-89 ml/min -baseline Scr 0.83- subtle increase since admission; suspect due to recent diarrhea -continue IVF and hold Metformin and ACE I -Follow lytes  COPD (chronic obstructive pulmonary  disease) -Compensated w/o wheezing  Hypothyroidism -Cont. Synthroid  DVT prophylaxis: SCDs Code Status: Full Family Communication: Patient and daughter Disposition Plan: Tele   Consultants: Gastroenterology  Procedures: None  Antibiotics: None  HPI/Subjective: Resting comfortably at present but has had a few further episodes of crampy abdominal pain.  Denies chest pain shortness of breath nausea or vomiting.  States that she is aware of palpitations when they occur.  Objective: Blood pressure 117/76, pulse 112, temperature 98.5 F (36.9 C), temperature source Oral, resp. rate 20, height 5\' 2"  (1.575 m), weight 85.276 kg (188 lb), SpO2 92.00%.  Intake/Output Summary (Last 24 hours) at 01/08/13 1811 Last data filed at 01/08/13 1630  Gross per 24 hour  Intake 2380.83 ml  Output   1475 ml  Net 905.83 ml   Exam: General: No acute respiratory distress Lungs: Clear to auscultation bilaterally without wheezes or crackles Cardiovascular: Regular rate and rhythm without murmur gallop or rub at the time of my evaluation with a heart rate of 70 beats per minute Abdomen: Mildly tender in left lower quadrant, nondistended, soft, bowel sounds positive, no rebound, no ascites, no appreciable mass Extremities: No significant cyanosis, clubbing, or edema bilateral lower extremities   Data Reviewed: Basic Metabolic Panel:  Recent Labs Lab 01/07/13 0139 01/07/13 0550 01/08/13 0535  NA 139 138 137  K 4.5 4.4 3.8  CL 97 101 100  CO2 30 27 26   GLUCOSE 73 129* 124*  BUN 32* 29* 14  CREATININE 1.37* 1.11* 0.97  CALCIUM 9.8 9.0 8.5   Liver Function Tests:  Recent Labs Lab 01/07/13 0139  AST 27  ALT 21  ALKPHOS 78  BILITOT 0.2*  PROT 7.6  ALBUMIN 4.0   CBC:  Recent Labs Lab 01/07/13 0139 01/07/13 0550 01/07/13 1235 01/08/13 0535  WBC 10.0 9.1 8.2 8.4  HGB 13.8 13.1 13.1 11.9*  HCT 39.2 37.4 37.4 35.3*  MCV 91.4 90.3 88.8 92.2  PLT 223 210 217 196    CBG:  Recent Labs Lab 01/07/13 2355 01/08/13 0400 01/08/13 0732 01/08/13 1243 01/08/13 1712  GLUCAP 120* 131* 100* 108* 140*    Recent Results (from the past 240 hour(s))  MRSA PCR SCREENING     Status: None   Collection Time    01/07/13  4:47 AM      Result Value Range Status   MRSA by PCR NEGATIVE  NEGATIVE Final   Comment:            The GeneXpert MRSA Assay (FDA     approved for NASAL specimens     only), is one component of a     comprehensive MRSA colonization     surveillance program. It is not     intended to diagnose MRSA     infection nor to guide or     monitor treatment for     MRSA infections.     Studies:  Recent x-ray studies have been reviewed in detail by the Attending Physician  Scheduled Meds:  Reviewed in detail by the Attending Physician   Lonia Blood, MD [facility] Office  [phone] Pager [phone]  On-Call/Text Page:      [email or web address]      password [email or web address]   If 7PM-7AM, please contact night-coverage [email or web address] Password [email or web address] 01/08/2013, 6:11 PM   LOS: 1 day

## 2013-01-08 NOTE — Progress Notes (Signed)
  Comment:  Has sinus tachycardia (confirmed by rhythm strip), with HR 130s-140s. BP=118/72. Asymptomatic, no chest pain or SOB.   O/E: Comfortable. . CHEST: Clinically clear. HEART: 1 and 2 heard, regular, tachycardic, no murmurs.  ABDO; Obese, soft, non-tender. BS heard.  A/P:  Patient with known lower GI bleed, secondary to ischemic colitis. Only blood "smears" PR at this time, and HB is normal. Reportedly, had brief episode of A. Fib on 01/07/13, and is on scheduled iv beta-blocker.   1. Suspect volume depletion, as SBP 101-118. Will give gentle bolus of 250 ml NS, and continue maintenance ivi fluid.  2. Continue beta-blocker.  3. Re-check lytes and CBC.   C. Oti. MD, FACP.

## 2013-01-08 NOTE — Progress Notes (Signed)
Pt's.HR=141;in heart monitor.pt.is asymptomatic,V/S=118/76;HR=142;Dr.Oti was called,& ordered to give a bolus of 250 ccNS,& said he is coming to see pt.

## 2013-01-08 NOTE — Progress Notes (Addendum)
[facility] Progress Note  Subjective: Patient is having some improvement in her abdominal pain although she did have some cramps and passed a bloody bowel movement  Objective: Vital signs in last 24 hours: Temp:  [98 F (36.7 C)-99 F (37.2 C)] 99 F (37.2 C) (03/08 0433) Pulse Rate:  [88-142] 109 (03/08 1100) Resp:  [18-20] 20 (03/08 0433) BP: (101-134)/(65-107) 134/72 mmHg (03/08 1100) SpO2:  [92 %-96 %] 92 % (03/08 0433) Weight change:    PE: Abdomen tender but less so than yesterday  Lab Results: Results for orders placed during the hospital encounter of 01/07/13 (from the past 24 hour(s))  GLUCOSE, CAPILLARY     Status: Abnormal   Collection Time    01/07/13 11:54 AM      Result Value Range   Glucose-Capillary 111 (*) 70 - 99 mg/dL  CBC     Status: None   Collection Time    01/07/13 12:35 PM      Result Value Range   WBC 8.2  4.0 - 10.5 K/uL   RBC 4.21  3.87 - 5.11 MIL/uL   Hemoglobin 13.1  12.0 - 15.0 g/dL   HCT 16.1  09.6 - 04.5 %   MCV 88.8  78.0 - 100.0 fL   MCH 31.1  26.0 - 34.0 pg   MCHC 35.0  30.0 - 36.0 g/dL   RDW 40.9  81.1 - 91.4 %   Platelets 217  150 - 400 K/uL  GLUCOSE, CAPILLARY     Status: Abnormal   Collection Time    01/07/13  5:25 PM      Result Value Range   Glucose-Capillary 115 (*) 70 - 99 mg/dL  GLUCOSE, CAPILLARY     Status: Abnormal   Collection Time    01/07/13 11:55 PM      Result Value Range   Glucose-Capillary 120 (*) 70 - 99 mg/dL   Comment 1 Notify RN     Comment 2 Documented in Chart    GLUCOSE, CAPILLARY     Status: Abnormal   Collection Time    01/08/13  4:00 AM      Result Value Range   Glucose-Capillary 131 (*) 70 - 99 mg/dL   Comment 1 Notify RN     Comment 2 Documented in Chart    CBC     Status: Abnormal   Collection Time    01/08/13  5:35 AM      Result Value Range   WBC 8.4  4.0 - 10.5 K/uL   RBC 3.83 (*) 3.87 - 5.11 MIL/uL   Hemoglobin 11.9 (*) 12.0 - 15.0 g/dL   HCT 78.2 (*) 95.6 - 21.3 %    MCV 92.2  78.0 - 100.0 fL   MCH 31.1  26.0 - 34.0 pg   MCHC 33.7  30.0 - 36.0 g/dL   RDW 08.6  57.8 - 46.9 %   Platelets 196  150 - 400 K/uL  BASIC METABOLIC PANEL     Status: Abnormal   Collection Time    01/08/13  5:35 AM      Result Value Range   Sodium 137  135 - 145 mEq/L   Potassium 3.8  3.5 - 5.1 mEq/L   Chloride 100  96 - 112 mEq/L   CO2 26  19 - 32 mEq/L   Glucose, Bld 124 (*) 70 - 99 mg/dL   BUN 14  6 - 23 mg/dL   Creatinine, Ser 6.29  0.50 - 1.10 mg/dL  Calcium 8.5  8.4 - 10.5 mg/dL   GFR calc non Af Amer 56 (*) >90 mL/min   GFR calc Af Amer 65 (*) >90 mL/min  LACTATE DEHYDROGENASE     Status: None   Collection Time    01/08/13  5:35 AM      Result Value Range   LDH 207  94 - 250 U/L  GLUCOSE, CAPILLARY     Status: Abnormal   Collection Time    01/08/13  7:32 AM      Result Value Range   Glucose-Capillary 100 (*) 70 - 99 mg/dL    Studies/Results: Ct Abdomen Pelvis W Contrast  01/07/2013  *RADIOLOGY REPORT*  Clinical Data: Suspected recurrent ischemic colitis  CT ABDOMEN AND PELVIS WITH CONTRAST  Technique:  Multidetector CT imaging of the abdomen and pelvis was performed following the standard protocol during bolus administration of intravenous contrast.  Contrast: OMNIPAQUE IOHEXOL 300 MG/ML  SOLN  Comparison: Prior CT abdomen/pelvis 11/13/2011  Findings:  Lower Chest:  The lung bases are clear.  The visualized cardiac structures within normal limits for size.  No pericardial effusion. Distal thoracic esophagus is unremarkable.  Abdomen: Visualized portions of the stomach, spleen, adrenal glands and liver are unremarkable.  There is a large periampullary duodenal diverticulum.  Punctate calcifications in the pancreatic head and uncinate process suggest the sequela of prior pancreatitis.  The gallbladder is surgically absent.  No significant biliary ductal dilatation.  No hydronephrosis or enhancing renal lesion.  Stable 6 - 7 mm calculus in the interpolar right  renal collecting system.  Diffuse submucosal edema and hyperenhancement of the mucosa beginning in the distal transverse colon and extending through the splenic flexure and descending colon.  There is associated stranding within the pericolonic fat along the entire abnormal segment. The segment of affected bowel is very similar to that seen on the prior CT scan.  No free air or fluid.  No evidence of obstruction.  Pelvis: Normal bladder, uterus and adnexa.  No free fluid or suspicious adenopathy.  Bones: No acute fracture or aggressive appearing lytic or blastic osseous lesion.  T11 hemangioma.  Vascular: Atherosclerotic calcification is noted throughout the abdominal aorta.  There is no aneurysmal dilatation, irregular thrombus or dissection.  The SMA is patent.  The IMA is also patent.  IMPRESSION:  1.  Recurrent colitis involving the distal transverse colon, splenic flexure and entire descending colon. No evidence of perforation or abscess development.  Differential considerations again include both inflammatory, and infectious etiologies including C difficile colitis.  Ischemia is considered less likely given the distribution and the patency of the SMA and IMA. However, nonocclusive mesenteric ischemia (NOMI) cannot be excluded radiographically.  2.  Additional ancillary findings as above without significant interval change.   Original Report Authenticated By: Malachy Moan, M.D.       Assessment: Probable ischemic colitis suspect small vessel vascular anomaly with patent SMA and IMA. Clinically improving no signs of impending necrosis  Plan: Continue conservative management antibiotics, bowel rest and clear liquid diet. Do not think colonoscopy will add much to diagnosis her treatment on this occasion. If she has a third bout would consider angiography.    HAYES,JOHN C 01/08/2013, 11:40 AM

## 2013-01-09 DIAGNOSIS — E118 Type 2 diabetes mellitus with unspecified complications: Secondary | ICD-10-CM

## 2013-01-09 DIAGNOSIS — K5289 Other specified noninfective gastroenteritis and colitis: Secondary | ICD-10-CM

## 2013-01-09 LAB — BASIC METABOLIC PANEL
CO2: 26 mEq/L (ref 19–32)
Chloride: 102 mEq/L (ref 96–112)
Creatinine, Ser: 0.76 mg/dL (ref 0.50–1.10)
GFR calc Af Amer: >90 mL/min (ref >90)
Potassium: 3.7 mEq/L (ref 3.5–5.1)
Sodium: 141 mEq/L (ref 135–145)

## 2013-01-09 LAB — GLUCOSE, CAPILLARY
Glucose-Capillary: 122 mg/dL — ABNORMAL HIGH (ref 70–99)
Glucose-Capillary: 105 mg/dL — ABNORMAL HIGH (ref 70–99)
Glucose-Capillary: 207 mg/dL — ABNORMAL HIGH (ref 70–99)

## 2013-01-09 LAB — CBC
MCV: 89.5 fL (ref 78.0–100.0)
Platelets: 192 10*3/uL (ref 150–400)
RBC: 3.92 MIL/uL (ref 3.87–5.11)
RDW: 13 % (ref 11.5–15.5)
WBC: 7.6 10*3/uL (ref 4.0–10.5)

## 2013-01-09 NOTE — Progress Notes (Addendum)
[facility] Progress Note    Miranda Erickson ZOX:[id number] DOB: 07/30/1935 DOA: 01/07/2013 PCP: Lorenda Peck, MD  Brief narrative: 78 year old female with known type 2 diabetes and chronic kidney disease. Had episodic rectal bleeding last year. Underwent colonoscopy which revealed likely ischemic colitis. Patient returned to the hospital with complaints of abrupt onset of crampy colicky lower abdominal pain associated with rectal bleeding. She also endorsed history of irritable bowel syndrome and over the past week having 2 episodes of nonbloody diarrhea associated with abdominal pain. Prior to presenting to the hospital she had severe abdominal pain with 4-5 episodes of rectal bleeding associated with weakness. Patient did use ibuprofen for pain relief. In the emergency department her hemoglobin was stable at 13 - her blood pressure was stable - the patient did endorse feeling weak upon standing.  Assessment/Plan:  Hx of ischemic Colitis / Current rectal bleeding c/w repeat episode -GI following - no plans for procedure at this time  -bleeding appears to have stopped, but intermittent pain persists -Hgb has dropped slightly - will cont to follow trend  Tachycardia - ?parox afib v/s sinus tachy  -pt has hx of MVP and is on chronic BB wch had to be held at admit due to hypotension  -BB withdrawal may be playing a role  -resumed oral BB w/ prn IV BB -cont to follow on tele and will order an echocardiogram.  -clearly not a candidate for anticoag at this time even if has true afib  Diabetes mellitus type 2, controlled -CBG controlled  -continue SSI -was on Metformin at home- CBG (last 3)   Recent Labs  01/09/13 0752 01/09/13 1208 01/09/13 1704  GLUCAP 122* 105* 115*      Hypertension -BP controlled; soft -Home Prinivil on hold  CKD (chronic kidney disease) stage 2, GFR 60-89 ml/min -baseline Scr 0.83- subtle increase since admission; suspect due to recent  diarrhea -continue IVF and hold Metformin and ACE I -Follow lytes  COPD (chronic obstructive pulmonary disease) -Compensated w/o wheezing  Hypothyroidism -Cont. Synthroid  DVT prophylaxis: SCDs Code Status: Full Family Communication: Patient and daughter Disposition Plan: Tele   Consultants: Gastroenterology  Procedures: None  Antibiotics: None  HPI/Subjective: Resting comfortably at present pain resolved.  Objective: Blood pressure 121/77, pulse 124, temperature 99.3 F (37.4 C), temperature source Oral, resp. rate 18, height 5\' 2"  (1.575 m), weight 85.276 kg (188 lb), SpO2 93.00%.  Intake/Output Summary (Last 24 hours) at 01/09/13 1153 Last data filed at 01/09/13 1038  Gross per 24 hour  Intake 2296.25 ml  Output   3700 ml  Net -1403.75 ml   Exam: General: No acute respiratory distress Lungs: Clear to auscultation bilaterally without wheezes or crackles Cardiovascular: Regular rate and rhythm without murmur gallop or rub at the time of my evaluation with a heart rate of 70 beats per minute Abdomen: no tenderness,, nondistended, soft, bowel sounds positive, no rebound, no ascites, no appreciable mass Extremities: No significant cyanosis, clubbing, or edema bilateral lower extremities   Data Reviewed: Basic Metabolic Panel:  Recent Labs Lab 01/07/13 0139 01/07/13 0550 01/08/13 0535 01/09/13 0555  NA 139 138 137 141  K 4.5 4.4 3.8 3.7  CL 97 101 100 102  CO2 30 27 26 26   GLUCOSE 73 129* 124* 135*  BUN 32* 29* 14 7  CREATININE 1.37* 1.11* 0.97 0.76  CALCIUM 9.8 9.0 8.5 8.7   Liver Function Tests:  Recent Labs Lab 01/07/13 0139  AST 27  ALT 21  ALKPHOS  78  BILITOT 0.2*  PROT 7.6  ALBUMIN 4.0   CBC:  Recent Labs Lab 01/07/13 0139 01/07/13 0550 01/07/13 1235 01/08/13 0535 01/09/13 0555  WBC 10.0 9.1 8.2 8.4 7.6  HGB 13.8 13.1 13.1 11.9* 12.1  HCT 39.2 37.4 37.4 35.3* 35.1*  MCV 91.4 90.3 88.8 92.2 89.5  PLT 223 210 217 196 192    CBG:  Recent Labs Lab 01/08/13 0732 01/08/13 1243 01/08/13 1712 01/08/13 2136 01/09/13 0752  GLUCAP 100* 108* 140* 143* 122*    Recent Results (from the past 240 hour(s))  MRSA PCR SCREENING     Status: None   Collection Time    01/07/13  4:47 AM      Result Value Range Status   MRSA by PCR NEGATIVE  NEGATIVE Final   Comment:            The GeneXpert MRSA Assay (FDA     approved for NASAL specimens     only), is one component of a     comprehensive MRSA colonization     surveillance program. It is not     intended to diagnose MRSA     infection nor to guide or     monitor treatment for     MRSA infections.       Kathlen Mody , MD [facility] Office  [phone] Pager [phone]  On-Call/Text Page:      [email or web address]      password [email or web address]   If 7PM-7AM, please contact night-coverage [email or web address] Password [email or web address] 01/09/2013, 11:53 AM   LOS: 2 days

## 2013-01-09 NOTE — Progress Notes (Signed)
[facility] Progress Note  Subjective: Patient feels much better, no stools basically no abdominal pain last 24 hours. Appetite still marginal  Objective: Vital signs in last 24 hours: Temp:  [97.5 F (36.4 C)-99.3 F (37.4 C)] 99.3 F (37.4 C) (03/09 0405) Pulse Rate:  [99-139] 124 (03/09 0405) Resp:  [18-20] 18 (03/09 0405) BP: (93-144)/(56-77) 121/77 mmHg (03/09 0405) SpO2:  [92 %-97 %] 93 % (03/09 0405) Weight change:    PE: Abdomen much less tender  Lab Results: Results for orders placed during the hospital encounter of 01/07/13 (from the past 24 hour(s))  GLUCOSE, CAPILLARY     Status: Abnormal   Collection Time    01/08/13  5:12 PM      Result Value Range   Glucose-Capillary 140 (*) 70 - 99 mg/dL  GLUCOSE, CAPILLARY     Status: Abnormal   Collection Time    01/08/13  9:36 PM      Result Value Range   Glucose-Capillary 143 (*) 70 - 99 mg/dL   Comment 1 Notify RN    CBC     Status: Abnormal   Collection Time    01/09/13  5:55 AM      Result Value Range   WBC 7.6  4.0 - 10.5 K/uL   RBC 3.92  3.87 - 5.11 MIL/uL   Hemoglobin 12.1  12.0 - 15.0 g/dL   HCT 16.1 (*) 09.6 - 04.5 %   MCV 89.5  78.0 - 100.0 fL   MCH 30.9  26.0 - 34.0 pg   MCHC 34.5  30.0 - 36.0 g/dL   RDW 40.9  81.1 - 91.4 %   Platelets 192  150 - 400 K/uL  BASIC METABOLIC PANEL     Status: Abnormal   Collection Time    01/09/13  5:55 AM      Result Value Range   Sodium 141  135 - 145 mEq/L   Potassium 3.7  3.5 - 5.1 mEq/L   Chloride 102  96 - 112 mEq/L   CO2 26  19 - 32 mEq/L   Glucose, Bld 135 (*) 70 - 99 mg/dL   BUN 7  6 - 23 mg/dL   Creatinine, Ser 7.82  0.50 - 1.10 mg/dL   Calcium 8.7  8.4 - 95.6 mg/dL   GFR calc non Af Amer 80 (*) >90 mL/min   GFR calc Af Amer >90  >90 mL/min  GLUCOSE, CAPILLARY     Status: Abnormal   Collection Time    01/09/13  7:52 AM      Result Value Range   Glucose-Capillary 122 (*) 70 - 99 mg/dL   Comment 1 Notify RN    GLUCOSE, CAPILLARY      Status: Abnormal   Collection Time    01/09/13 12:08 PM      Result Value Range   Glucose-Capillary 105 (*) 70 - 99 mg/dL   Comment 1 Notify RN      Studies/Results: Ct Abdomen Pelvis W Contrast  01/07/2013  *RADIOLOGY REPORT*  Clinical Data: Suspected recurrent ischemic colitis  CT ABDOMEN AND PELVIS WITH CONTRAST  Technique:  Multidetector CT imaging of the abdomen and pelvis was performed following the standard protocol during bolus administration of intravenous contrast.  Contrast: OMNIPAQUE IOHEXOL 300 MG/ML  SOLN  Comparison: Prior CT abdomen/pelvis 11/13/2011  Findings:  Lower Chest:  The lung bases are clear.  The visualized cardiac structures within normal limits for size.  No pericardial effusion. Distal thoracic esophagus is  unremarkable.  Abdomen: Visualized portions of the stomach, spleen, adrenal glands and liver are unremarkable.  There is a large periampullary duodenal diverticulum.  Punctate calcifications in the pancreatic head and uncinate process suggest the sequela of prior pancreatitis.  The gallbladder is surgically absent.  No significant biliary ductal dilatation.  No hydronephrosis or enhancing renal lesion.  Stable 6 - 7 mm calculus in the interpolar right renal collecting system.  Diffuse submucosal edema and hyperenhancement of the mucosa beginning in the distal transverse colon and extending through the splenic flexure and descending colon.  There is associated stranding within the pericolonic fat along the entire abnormal segment. The segment of affected bowel is very similar to that seen on the prior CT scan.  No free air or fluid.  No evidence of obstruction.  Pelvis: Normal bladder, uterus and adnexa.  No free fluid or suspicious adenopathy.  Bones: No acute fracture or aggressive appearing lytic or blastic osseous lesion.  T11 hemangioma.  Vascular: Atherosclerotic calcification is noted throughout the abdominal aorta.  There is no aneurysmal dilatation, irregular  thrombus or dissection.  The SMA is patent.  The IMA is also patent.  IMPRESSION:  1.  Recurrent colitis involving the distal transverse colon, splenic flexure and entire descending colon. No evidence of perforation or abscess development.  Differential considerations again include both inflammatory, and infectious etiologies including C difficile colitis.  Ischemia is considered less likely given the distribution and the patency of the SMA and IMA. However, nonocclusive mesenteric ischemia (NOMI) cannot be excluded radiographically.  2.  Additional ancillary findings as above without significant interval change.   Original Report Authenticated By: Malachy Moan, M.D.       Assessment: Probable ischemic colitis, second occurrence in 2 years, SMA and IMA patent by CT scan, seems to be improving  Plan: Continue conservative management. Will advance diet to full liquid today. May need angiography if has a third bout in the future. Do not think colonoscopy on this occasion will add much to her diagnosis or treatment.  HAYES,JOHN C 01/09/2013, 12:45 PM

## 2013-01-10 LAB — GLUCOSE, CAPILLARY
Glucose-Capillary: 170 mg/dL — ABNORMAL HIGH (ref 70–99)
Glucose-Capillary: 157 mg/dL — ABNORMAL HIGH (ref 70–99)
Glucose-Capillary: 130 mg/dL — ABNORMAL HIGH (ref 70–99)

## 2013-01-10 MED ORDER — POLYETHYLENE GLYCOL 3350 17 G PO PACK
17.0000 g | PACK | Freq: Three times a day (TID) | ORAL | Status: DC
  Administered 2013-01-10 – 2013-01-11 (×4): 17 g via ORAL
  Filled 2013-01-10 (×6): qty 1

## 2013-01-10 NOTE — Progress Notes (Signed)
[facility] Progress Note    Miranda Erickson:[id number] DOB: 11/24/1934 DOA: 01/07/2013 PCP: Lorenda Peck, MD  Brief narrative: 78 year old female with known type 2 diabetes and chronic kidney disease. Had episodic rectal bleeding last year. Underwent colonoscopy which revealed likely ischemic colitis. Patient returned to the hospital with complaints of abrupt onset of crampy colicky lower abdominal pain associated with rectal bleeding. She also endorsed history of irritable bowel syndrome and over the past week having 2 episodes of nonbloody diarrhea associated with abdominal pain. Prior to presenting to the hospital she had severe abdominal pain with 4-5 episodes of rectal bleeding associated with weakness. Patient did use ibuprofen for pain relief. In the emergency department her hemoglobin was stable at 13 - her blood pressure was stable - the patient did endorse feeling weak upon standing.  Assessment/Plan:  Hx of ischemic Colitis / Current rectal bleeding c/w repeat episode -GI following - no plans for procedure at this time  -bleeding appears to have stopped, but intermittent pain persists -Hgb has dropped slightly - will cont to follow trend  Tachycardia - ?parox afib v/s sinus tachy  -pt has hx of MVP and is on chronic BB wch had to be held at admit due to hypotension  -BB withdrawal may be playing a role  -resumed oral BB w/ prn IV BB -cont to follow on tele and will order an echocardiogram.  -clearly not a candidate for anticoag at this time even if has true afib  Diabetes mellitus type 2, controlled -CBG controlled  -continue SSI -was on Metformin at home- CBG (last 3)   Recent Labs  01/10/13 0810 01/10/13 1225 01/10/13 1714  GLUCAP 138* 170* 157*      Hypertension -BP controlled; soft -Home Prinivil on hold  CKD (chronic kidney disease) stage 2, GFR 60-89 ml/min -baseline Scr 0.83- subtle increase since admission; suspect due to recent  diarrhea -continue IVF and hold Metformin and ACE I -Follow lytes  COPD (chronic obstructive pulmonary disease) -Compensated w/o wheezing  Hypothyroidism -Cont. Synthroid  DVT prophylaxis: SCDs Code Status: Full Family Communication: Patient and daughter Disposition Plan: Tele   Consultants: Gastroenterology  Procedures: None  Antibiotics: None  HPI/Subjective: Resting comfortably at present pain resolved.  Objective: Blood pressure 135/76, pulse 95, temperature 98.3 F (36.8 C), temperature source Oral, resp. rate 18, height 5\' 2"  (1.575 m), weight 85.276 kg (188 lb), SpO2 96.00%.  Intake/Output Summary (Last 24 hours) at 01/10/13 1932 Last data filed at 01/10/13 1300  Gross per 24 hour  Intake    476 ml  Output    900 ml  Net   -424 ml   Exam: General: No acute respiratory distress Lungs: Clear to auscultation bilaterally without wheezes or crackles Cardiovascular: Regular rate and rhythm without murmur gallop or rub at the time of my evaluation with a heart rate of 70 beats per minute Abdomen: no tenderness,, nondistended, soft, bowel sounds positive, no rebound, no ascites, no appreciable mass Extremities: No significant cyanosis, clubbing, or edema bilateral lower extremities   Data Reviewed: Basic Metabolic Panel:  Recent Labs Lab 01/07/13 0139 01/07/13 0550 01/08/13 0535 01/09/13 0555  NA 139 138 137 141  K 4.5 4.4 3.8 3.7  CL 97 101 100 102  CO2 30 27 26 26   GLUCOSE 73 129* 124* 135*  BUN 32* 29* 14 7  CREATININE 1.37* 1.11* 0.97 0.76  CALCIUM 9.8 9.0 8.5 8.7   Liver Function Tests:  Recent Labs Lab 01/07/13 0139  AST  27  ALT 21  ALKPHOS 78  BILITOT 0.2*  PROT 7.6  ALBUMIN 4.0   CBC:  Recent Labs Lab 01/07/13 0139 01/07/13 0550 01/07/13 1235 01/08/13 0535 01/09/13 0555  WBC 10.0 9.1 8.2 8.4 7.6  HGB 13.8 13.1 13.1 11.9* 12.1  HCT 39.2 37.4 37.4 35.3* 35.1*  MCV 91.4 90.3 88.8 92.2 89.5  PLT 223 210 217 196 192    CBG:  Recent Labs Lab 01/09/13 1704 01/09/13 2147 01/10/13 0810 01/10/13 1225 01/10/13 1714  GLUCAP 115* 207* 138* 170* 157*    Recent Results (from the past 240 hour(s))  MRSA PCR SCREENING     Status: None   Collection Time    01/07/13  4:47 AM      Result Value Range Status   MRSA by PCR NEGATIVE  NEGATIVE Final   Comment:            The GeneXpert MRSA Assay (FDA     approved for NASAL specimens     only), is one component of a     comprehensive MRSA colonization     surveillance program. It is not     intended to diagnose MRSA     infection nor to guide or     monitor treatment for     MRSA infections.       Kathlen Mody , MD [facility] Office  [phone] Pager [phone]  On-Call/Text Page:      [email or web address]      password [email or web address]   If 7PM-7AM, please contact night-coverage [email or web address] Password [email or web address] 01/10/2013, 7:32 PM   LOS: 3 days

## 2013-01-10 NOTE — Evaluation (Signed)
Physical Therapy Evaluation Patient Details Name: Miranda Erickson MRN: [id number] DOB: 1935-02-08 Today's Date: 01/10/2013 Time: 8295-6213 PT Time Calculation (min): 22 min  PT Assessment / Plan / Recommendation Clinical Impression  78 y.o. female admitted with rectal bleed, ischemic colitis. Pt ambulated 225' with RW independently. Pt is modified independent with mobility, no further PT needed. No DME needs. OK to DC home from PT standpoint.    PT Assessment  Patent does not need any further PT services    Follow Up Recommendations  No PT follow up    Does the patient have the potential to tolerate intense rehabilitation      Barriers to Discharge        Equipment Recommendations  None recommended by PT    Recommendations for Other Services     Frequency      Precautions / Restrictions Restrictions Weight Bearing Restrictions: No   Pertinent Vitals/Pain **0/10 pain HR 88 at rest, 96 with walking*      Mobility  Bed Mobility Bed Mobility: Supine to Sit Supine to Sit: 6: Modified independent (Device/Increase time);HOB flat Transfers Transfers: Sit to Stand;Stand to Sit Sit to Stand: 6: Modified independent (Device/Increase time);From bed Stand to Sit: 6: Modified independent (Device/Increase time);To chair/3-in-1 Ambulation/Gait Ambulation/Gait Assistance: 6: Modified independent (Device/Increase time) Ambulation Distance (Feet): 225 Feet Assistive device: Rolling walker Gait Pattern: Within Functional Limits General Gait Details: steady, no LOB    Exercises     PT Diagnosis:    PT Problem List:   PT Treatment Interventions:     PT Goals    Visit Information  Last PT Received On: 01/10/13 Assistance Needed: +1    Subjective Data  Subjective: I've been walking to the bathroom.  Patient Stated Goal: return home to her 2 dogs   Prior Functioning  Home Living Lives With: Alone Available Help at Discharge: Family;Available PRN/intermittently Type of  Home: Mobile home Home Access: Stairs to enter Entrance Stairs-Number of Steps: 5 Entrance Stairs-Rails: Can reach both;Left;Right Home Layout: One level Bathroom Shower/Tub: Engineer, manufacturing systems: Standard Home Adaptive Equipment: Wheelchair - [employer] - four wheeled;Straight cane;Shower chair with back;Raised toilet seat with rails Additional Comments: uses cane for stairs goes down sideways, uses RW inside Prior Function Level of Independence: Independent Driving: No Communication Communication: No difficulties    Cognition  Cognition Overall Cognitive Status: Appears within functional limits for tasks assessed/performed Arousal/Alertness: Awake/alert Orientation Level: Appears intact for tasks assessed Behavior During Session: [facility] for tasks performed    Extremity/Trunk Assessment Right Upper Extremity Assessment RUE ROM/Strength/Tone: [facility] for tasks assessed Left Upper Extremity Assessment LUE ROM/Strength/Tone: WFL for tasks assessed Right Lower Extremity Assessment RLE ROM/Strength/Tone: Within functional levels RLE Sensation: WFL - Light Touch RLE Coordination: WFL - gross/fine motor Left Lower Extremity Assessment LLE ROM/Strength/Tone: Within functional levels LLE Sensation: WFL - Light Touch LLE Coordination: WFL - gross/fine motor Trunk Assessment Trunk Assessment: Normal   Balance    End of Session PT - End of Session Equipment Utilized During Treatment: Gait belt Activity Tolerance: Patient tolerated treatment well Patient left: in chair;with call bell/phone within reach Nurse Communication: Mobility status  GP     Ralene Bathe Kistler 01/10/2013, 1:31 PM [phone]

## 2013-01-10 NOTE — Progress Notes (Signed)
[facility] PROGRESS NOTE Subjective patient denies pain tolerating full liquid diet without problems  Objective: Vital signs in last 24 hours: Temp:  [97.7 F (36.5 C)-99.3 F (37.4 C)] 98.3 F (36.8 C) (03/10 0530) Pulse Rate:  [95-136] 95 (03/10 0530) Resp:  [18] 18 (03/10 0530) BP: (124-150)/(76-89) 135/76 mmHg (03/10 0530) SpO2:  [94 %-96 %] 96 % (03/10 0530) Last BM Date: 01/08/13  Intake/Output from previous day: 03/09 0701 - 03/10 0700 In: 1200 [P.O.:1200] Out: 600 [Urine:600] Intake/Output this shift: Total I/O In: -  Out: 400 [Urine:400]  PE: General -- eating breakfast no distress Abdomen -- soft nontender  Lab Results:  Recent Labs  01/07/13 1235 01/08/13 0535 01/09/13 0555  WBC 8.2 8.4 7.6  HGB 13.1 11.9* 12.1  HCT 37.4 35.3* 35.1*  PLT 217 196 192   BMET  Recent Labs  01/08/13 0535 01/09/13 0555  NA 137 141  K 3.8 3.7  CL 100 102  CO2 26 26  CREATININE 0.97 0.76   LFT No results found for this basename: PROT, AST, ALT, ALKPHOS, BILITOT, BILIDIR, IBILI,  in the last 72 hours PT/INR No results found for this basename: LABPROT, INR,  in the last 72 hours PANCREAS No results found for this basename: LIPASE,  in the last 72 hours       Studies/Results: No results found.  Medications: I have reviewed the patient's current medications.  Assessment/Plan: 1. Ischemic colitis appears to be improving. Have discussed with patient. We'll start her low residue diet and add Miralax. If she tolerates that she could be discharged in follow-up with Dr. Madilyn Fireman as an outpatient.   EDWARDS JR,JAMES L 01/10/2013, 9:23 AM

## 2013-01-10 NOTE — Progress Notes (Signed)
  Echocardiogram 2D Echocardiogram has been performed.  Ellender Hose A 01/10/2013, 4:37 PM

## 2013-01-11 MED ORDER — LORATADINE 10 MG PO TABS
10.0000 mg | ORAL_TABLET | Freq: Every day | ORAL | Status: DC
  Administered 2013-01-11: 10 mg via ORAL
  Filled 2013-01-11: qty 1

## 2013-01-11 MED ORDER — COLCHICINE 0.6 MG PO TABS
0.6000 mg | ORAL_TABLET | Freq: Every day | ORAL | Status: DC
  Administered 2013-01-11: 0.6 mg via ORAL
  Filled 2013-01-11: qty 1

## 2013-01-11 MED ORDER — COLCHICINE 0.6 MG PO TABS: 0.6000 mg | ORAL_TABLET | Freq: Every day | ORAL | Status: DC

## 2013-01-11 NOTE — Discharge Summary (Signed)
Physician Discharge Summary  Miranda Erickson:[id number] DOB: 04-15-1935 DOA: 01/07/2013  PCP: Lorenda Peck, MD  Admit date: 01/07/2013 Discharge date: 01/11/2013    Recommendations for Outpatient Follow-up:  1. Follow up with GI as recommended.   Discharge Diagnoses:  Active Problems:   Diabetes mellitus type 2, controlled, with complications   COPD (chronic obstructive pulmonary disease)   Hypertension   Hypothyroidism   Prior ischemic Colitis   Rectal bleeding   CKD (chronic kidney disease) stage 2, GFR 60-89 ml/min   Discharge Condition: fair  Diet recommendation: low salt diet  Filed Weights   01/07/13 0500  Weight: 85.276 kg (188 lb)    History of present illness:  Miranda Erickson is a 78 y.o. female with known history of diabetes mellitus type 2, hypertension and hyperlipidemia who has had rectal bleeding last year and that time colonoscopy was done and showing features of ischemic colitis presents with complaints of having rectal bleeding since last evening. Patient states that she has irritable bowel syndrome and over the last one week she has had at least 2 episodes of diarrhea with abdominal discomfort. But last evening her abdominal discomfort became more intense with 4-5 episodes of rectal bleeding which was progressively worsening with patient experiencing weakness. Denies any nausea vomiting or any fever chills. Patient has not used any recent antibiotics. Patient does use ibuprofen for pain relief. In the ER patient's hemoglobin is found to be around 13 patient at this time is hemodynamically stable but feels weak on standing. Patient will be admitted for further management. Patient denies any chest pain or shortness of breath.   Hospital Course:  Hx of ischemic Colitis / Current rectal bleeding c/w repeat episode  -GI following - no plans for procedure at this time  -bleeding appears to have stopped, and abd pain resolved. She was restarted on diet and  advanced as tolerated .  She will follow u with GI as recommended.  Tachycardia - ?parox afib v/s sinus tachy  -pt has hx of MVP and is on chronic BB wch had to be held at admit due to hypotension  -BB withdrawal may be playing a role  -resumed oral BB  -Echocardiogram insignificant for acute pathology.  -clearly not a candidate for anticoag at this time even if has true afib  Diabetes mellitus type 2, controlled  -CBG controlled  -continue SSI  -was on Metformin at home-  CBG (last 3)   Recent Labs   01/10/13 0810  01/10/13 1225  01/10/13 1714   GLUCAP  138*  170*  157*    Hypertension  -BP controlled;  CKD (chronic kidney disease) stage 2, GFR 60-89 ml/min  -baseline Scr 0.83- subtle increase since admission; suspect due to recent diarrhea  - i mproving.  COPD (chronic obstructive pulmonary disease)  -Compensated w/o wheezing  Hypothyroidism Resume home medication.   Consultations:  GI  Discharge Exam: Filed Vitals:   01/10/13 0530 01/10/13 1326 01/10/13 2036 01/11/13 0522  BP: 135/76 136/77 148/84 125/72  Pulse: 95 85 94 91  Temp: 98.3 F (36.8 C) 98.3 F (36.8 C) 98.2 F (36.8 C) 98.7 F (37.1 C)  TempSrc: Oral Oral Oral Oral  Resp: 18 20 16 18   Height:      Weight:      SpO2: 96% 97% 95% 93%    General: No acute respiratory distress  Lungs: Clear to auscultation bilaterally without wheezes or crackles  Cardiovascular: Regular rate and rhythm without murmur gallop or  rub at the time of my evaluation with a heart rate of 70 beats per minute  Abdomen: no tenderness,, nondistended, soft, bowel sounds positive, no rebound, no ascites, no appreciable mass  Extremities: No significant cyanosis, clubbing, or edema bilateral lower extremities   Discharge Instructions  Discharge Orders   Future Orders Complete By Expires     Diet Carb Modified  As directed     Discharge instructions  As directed     Comments:      Follow up with GI as needed.  Follow up  with PCP in one to 2 weeks.        Medication List    STOP taking these medications       ibuprofen 200 MG tablet  Commonly known as:  ADVIL,MOTRIN      TAKE these medications       albuterol 108 (90 BASE) MCG/ACT inhaler  Commonly known as:  PROVENTIL HFA;VENTOLIN HFA  Inhale 2 puffs into the lungs every 6 (six) hours as needed. Shortness of breath     amitriptyline 100 MG tablet  Commonly known as:  ELAVIL  Take 100 mg by mouth at bedtime.     atenolol 100 MG tablet  Commonly known as:  TENORMIN  Take 100 mg by mouth daily.     atorvastatin 20 MG tablet  Commonly known as:  LIPITOR  Take 20 mg by mouth daily.     calcium carbonate 600 MG Tabs  Commonly known as:  OS-CAL  Take 600 mg by mouth daily.     chlorthalidone 25 MG tablet  Commonly known as:  HYGROTON  Take 25 mg by mouth daily.     colchicine 0.6 MG tablet  Take 1 tablet (0.6 mg total) by mouth daily.     insulin NPH-insulin regular (70-30) 100 UNIT/ML injection  Commonly known as:  NOVOLIN 70/30  Inject 55 Units into the skin daily with breakfast. Take 55 units in the morning and 45 units in the evening     levothyroxine 50 MCG tablet  Commonly known as:  SYNTHROID, LEVOTHROID  Take 50 mcg by mouth daily.     lisinopril 10 MG tablet  Commonly known as:  PRINIVIL,ZESTRIL  Take 20 mg by mouth daily.     metFORMIN 1000 MG tablet  Commonly known as:  GLUCOPHAGE  Take 1,000 mg by mouth 2 (two) times daily with a meal.          The results of significant diagnostics from this hospitalization (including imaging, microbiology, ancillary and laboratory) are listed below for reference.    Significant Diagnostic Studies: Ct Abdomen Pelvis W Contrast  01/07/2013  *RADIOLOGY REPORT*  Clinical Data: Suspected recurrent ischemic colitis  CT ABDOMEN AND PELVIS WITH CONTRAST  Technique:  Multidetector CT imaging of the abdomen and pelvis was performed following the standard protocol during bolus  administration of intravenous contrast.  Contrast: OMNIPAQUE IOHEXOL 300 MG/ML  SOLN  Comparison: Prior CT abdomen/pelvis 11/13/2011  Findings:  Lower Chest:  The lung bases are clear.  The visualized cardiac structures within normal limits for size.  No pericardial effusion. Distal thoracic esophagus is unremarkable.  Abdomen: Visualized portions of the stomach, spleen, adrenal glands and liver are unremarkable.  There is a large periampullary duodenal diverticulum.  Punctate calcifications in the pancreatic head and uncinate process suggest the sequela of prior pancreatitis.  The gallbladder is surgically absent.  No significant biliary ductal dilatation.  No hydronephrosis or enhancing renal lesion.  Stable  6 - 7 mm calculus in the interpolar right renal collecting system.  Diffuse submucosal edema and hyperenhancement of the mucosa beginning in the distal transverse colon and extending through the splenic flexure and descending colon.  There is associated stranding within the pericolonic fat along the entire abnormal segment. The segment of affected bowel is very similar to that seen on the prior CT scan.  No free air or fluid.  No evidence of obstruction.  Pelvis: Normal bladder, uterus and adnexa.  No free fluid or suspicious adenopathy.  Bones: No acute fracture or aggressive appearing lytic or blastic osseous lesion.  T11 hemangioma.  Vascular: Atherosclerotic calcification is noted throughout the abdominal aorta.  There is no aneurysmal dilatation, irregular thrombus or dissection.  The SMA is patent.  The IMA is also patent.  IMPRESSION:  1.  Recurrent colitis involving the distal transverse colon, splenic flexure and entire descending colon. No evidence of perforation or abscess development.  Differential considerations again include both inflammatory, and infectious etiologies including C difficile colitis.  Ischemia is considered less likely given the distribution and the patency of the SMA and  IMA. However, nonocclusive mesenteric ischemia (NOMI) cannot be excluded radiographically.  2.  Additional ancillary findings as above without significant interval change.   Original Report Authenticated By: Malachy Moan, M.D.     Microbiology: Recent Results (from the past 240 hour(s))  MRSA PCR SCREENING     Status: None   Collection Time    01/07/13  4:47 AM      Result Value Range Status   MRSA by PCR NEGATIVE  NEGATIVE Final   Comment:            The GeneXpert MRSA Assay (FDA     approved for NASAL specimens     only), is one component of a     comprehensive MRSA colonization     surveillance program. It is not     intended to diagnose MRSA     infection nor to guide or     monitor treatment for     MRSA infections.     Labs: Basic Metabolic Panel:  Recent Labs Lab 01/07/13 0139 01/07/13 0550 01/08/13 0535 01/09/13 0555  NA 139 138 137 141  K 4.5 4.4 3.8 3.7  CL 97 101 100 102  CO2 30 27 26 26   GLUCOSE 73 129* 124* 135*  BUN 32* 29* 14 7  CREATININE 1.37* 1.11* 0.97 0.76  CALCIUM 9.8 9.0 8.5 8.7   Liver Function Tests:  Recent Labs Lab 01/07/13 0139  AST 27  ALT 21  ALKPHOS 78  BILITOT 0.2*  PROT 7.6  ALBUMIN 4.0   No results found for this basename: LIPASE, AMYLASE,  in the last 168 hours No results found for this basename: AMMONIA,  in the last 168 hours CBC:  Recent Labs Lab 01/07/13 0139 01/07/13 0550 01/07/13 1235 01/08/13 0535 01/09/13 0555  WBC 10.0 9.1 8.2 8.4 7.6  HGB 13.8 13.1 13.1 11.9* 12.1  HCT 39.2 37.4 37.4 35.3* 35.1*  MCV 91.4 90.3 88.8 92.2 89.5  PLT 223 210 217 196 192   Cardiac Enzymes: No results found for this basename: CKTOTAL, CKMB, CKMBINDEX, TROPONINI,  in the last 168 hours BNP: BNP (last 3 results) No results found for this basename: PROBNP,  in the last 8760 hours CBG:  Recent Labs Lab 01/10/13 0810 01/10/13 1225 01/10/13 1714 01/10/13 2158 01/11/13 0746  GLUCAP 138* 170* 157* 130* 147*  SignedKathlen Mody  [facility] 01/11/2013, 11:40 AM

## 2013-01-11 NOTE — Progress Notes (Signed)
1340 Discharge instructions reviewed with patient verbalize and understands. Prescription given to patient. Skin WNL except right foot with redden middle toe Dr. Blake Divine aware. Patient waiting on ride will be here around 1630 today.

## 2013-07-17 ENCOUNTER — Inpatient Hospital Stay (HOSPITAL_COMMUNITY): Payer: Medicare Other

## 2013-07-17 ENCOUNTER — Encounter (HOSPITAL_COMMUNITY): Payer: Self-pay | Admitting: Emergency Medicine

## 2013-07-17 ENCOUNTER — Inpatient Hospital Stay (HOSPITAL_COMMUNITY)
Admission: EM | Admit: 2013-07-17 | Discharge: 2013-07-19 | DRG: 395 | Disposition: A | Discharge status: Home / Self Care | Payer: Medicare Other | Attending: Family Medicine | Admitting: Family Medicine

## 2013-07-17 DIAGNOSIS — F17200 Nicotine dependence, unspecified, uncomplicated: Secondary | ICD-10-CM | POA: Diagnosis present

## 2013-07-17 DIAGNOSIS — J4489 Other specified chronic obstructive pulmonary disease: Secondary | ICD-10-CM | POA: Diagnosis present

## 2013-07-17 DIAGNOSIS — K559 Vascular disorder of intestine, unspecified: Principal | ICD-10-CM | POA: Diagnosis present

## 2013-07-17 DIAGNOSIS — J449 Chronic obstructive pulmonary disease, unspecified: Secondary | ICD-10-CM

## 2013-07-17 DIAGNOSIS — N182 Chronic kidney disease, stage 2 (mild): Secondary | ICD-10-CM

## 2013-07-17 DIAGNOSIS — I129 Hypertensive chronic kidney disease with stage 1 through stage 4 chronic kidney disease, or unspecified chronic kidney disease: Secondary | ICD-10-CM | POA: Diagnosis present

## 2013-07-17 DIAGNOSIS — M109 Gout, unspecified: Secondary | ICD-10-CM

## 2013-07-17 DIAGNOSIS — Z794 Long term (current) use of insulin: Secondary | ICD-10-CM

## 2013-07-17 DIAGNOSIS — I059 Rheumatic mitral valve disease, unspecified: Secondary | ICD-10-CM | POA: Diagnosis present

## 2013-07-17 DIAGNOSIS — E039 Hypothyroidism, unspecified: Secondary | ICD-10-CM

## 2013-07-17 DIAGNOSIS — E119 Type 2 diabetes mellitus without complications: Secondary | ICD-10-CM | POA: Diagnosis present

## 2013-07-17 DIAGNOSIS — K625 Hemorrhage of anus and rectum: Secondary | ICD-10-CM

## 2013-07-17 DIAGNOSIS — I10 Essential (primary) hypertension: Secondary | ICD-10-CM

## 2013-07-17 DIAGNOSIS — E118 Type 2 diabetes mellitus with unspecified complications: Secondary | ICD-10-CM

## 2013-07-17 DIAGNOSIS — K529 Noninfective gastroenteritis and colitis, unspecified: Secondary | ICD-10-CM

## 2013-07-17 DIAGNOSIS — I4891 Unspecified atrial fibrillation: Secondary | ICD-10-CM

## 2013-07-17 DIAGNOSIS — K922 Gastrointestinal hemorrhage, unspecified: Secondary | ICD-10-CM

## 2013-07-17 LAB — CBC WITH DIFFERENTIAL/PLATELET
Basophils Absolute: 0 10*3/uL (ref 0.0–0.1)
Basophils Relative: 0 % (ref 0–1)
Eosinophils Absolute: 0 10*3/uL (ref 0.0–0.7)
MCH: 31.2 pg (ref 26.0–34.0)
MCHC: 34.2 g/dL (ref 30.0–36.0)
Neutrophils Relative %: 73 % (ref 43–77)
Platelets: 255 10*3/uL (ref 150–400)
RBC: 4.01 MIL/uL (ref 3.87–5.11)
RDW: 13.1 % (ref 11.5–15.5)

## 2013-07-17 LAB — TYPE AND SCREEN: Antibody Screen: NEGATIVE

## 2013-07-17 LAB — CBC
HCT: 34.2 % — ABNORMAL LOW (ref 36.0–46.0)
Platelets: 248 10*3/uL (ref 150–400)
RDW: 13.1 % (ref 11.5–15.5)
WBC: 6.3 10*3/uL (ref 4.0–10.5)

## 2013-07-17 LAB — COMPREHENSIVE METABOLIC PANEL
ALT: 22 U/L (ref 0–35)
Albumin: 3.7 g/dL (ref 3.5–5.2)
Alkaline Phosphatase: 62 U/L (ref 39–117)
Potassium: 4.3 mEq/L (ref 3.5–5.1)
Sodium: 135 mEq/L (ref 135–145)
Total Protein: 7.2 g/dL (ref 6.0–8.3)

## 2013-07-17 LAB — PROTIME-INR
INR: 0.99 (ref 0.00–1.49)
Prothrombin Time: 12.9 seconds (ref 11.6–15.2)

## 2013-07-17 LAB — GLUCOSE, CAPILLARY: Glucose-Capillary: 130 mg/dL — ABNORMAL HIGH (ref 70–99)

## 2013-07-17 MED ORDER — INSULIN NPH (HUMAN) (ISOPHANE) 100 UNIT/ML ~~LOC~~ SUSP
15.0000 [IU] | Freq: Every day | SUBCUTANEOUS | Status: DC
Start: 2013-07-18 — End: 2013-07-19
  Administered 2013-07-18 – 2013-07-19 (×2): 15 [IU] via SUBCUTANEOUS
  Filled 2013-07-17: qty 10

## 2013-07-17 MED ORDER — ATENOLOL 100 MG PO TABS: 100.0000 mg | ORAL_TABLET | Freq: Every morning | ORAL | Status: DC

## 2013-07-17 MED ORDER — MORPHINE SULFATE 2 MG/ML IJ SOLN
1.0000 mg | Freq: Once | INTRAMUSCULAR | Status: AC
  Administered 2013-07-17: 1 mg via INTRAVENOUS
  Filled 2013-07-17: qty 1

## 2013-07-17 MED ORDER — SODIUM CHLORIDE 0.9 % IV SOLN
INTRAVENOUS | Status: AC
  Administered 2013-07-18: via INTRAVENOUS

## 2013-07-17 MED ORDER — ACETAMINOPHEN 325 MG PO TABS: 650.0000 mg | ORAL_TABLET | Freq: Four times a day (QID) | ORAL | Status: DC | PRN

## 2013-07-17 MED ORDER — METRONIDAZOLE IN NACL 5-0.79 MG/ML-% IV SOLN
500.0000 mg | Freq: Three times a day (TID) | INTRAVENOUS | Status: DC
  Administered 2013-07-18 (×2): 500 mg via INTRAVENOUS
  Filled 2013-07-17 (×3): qty 100

## 2013-07-17 MED ORDER — AMITRIPTYLINE HCL 100 MG PO TABS
100.0000 mg | ORAL_TABLET | Freq: Every day | ORAL | Status: DC
  Administered 2013-07-17 – 2013-07-18 (×2): 100 mg via ORAL
  Filled 2013-07-17 (×3): qty 1

## 2013-07-17 MED ORDER — INSULIN ASPART 100 UNIT/ML ~~LOC~~ SOLN
0.0000 [IU] | Freq: Three times a day (TID) | SUBCUTANEOUS | Status: DC
  Administered 2013-07-18: 13:00:00 1 [IU] via SUBCUTANEOUS
  Administered 2013-07-18 – 2013-07-19 (×3): 2 [IU] via SUBCUTANEOUS
  Administered 2013-07-19: 08:00:00 1 [IU] via SUBCUTANEOUS

## 2013-07-17 MED ORDER — ATENOLOL 100 MG PO TABS
100.0000 mg | ORAL_TABLET | Freq: Every day | ORAL | Status: DC
  Administered 2013-07-17 – 2013-07-19 (×3): 100 mg via ORAL
  Filled 2013-07-17: qty 2
  Filled 2013-07-17 (×2): qty 1

## 2013-07-17 MED ORDER — PANTOPRAZOLE SODIUM 40 MG IV SOLR
40.0000 mg | Freq: Two times a day (BID) | INTRAVENOUS | Status: DC
  Administered 2013-07-17 – 2013-07-18 (×2): 40 mg via INTRAVENOUS
  Filled 2013-07-17 (×3): qty 40

## 2013-07-17 MED ORDER — ATORVASTATIN CALCIUM 20 MG PO TABS
20.0000 mg | ORAL_TABLET | Freq: Every day | ORAL | Status: DC
  Administered 2013-07-18 – 2013-07-19 (×2): 20 mg via ORAL
  Filled 2013-07-17 (×2): qty 1

## 2013-07-17 MED ORDER — ONDANSETRON HCL 4 MG/2ML IJ SOLN: 4.0000 mg | Freq: Four times a day (QID) | INTRAMUSCULAR | Status: DC | PRN

## 2013-07-17 MED ORDER — ALBUTEROL SULFATE HFA 108 (90 BASE) MCG/ACT IN AERS: 2.0000 | INHALATION_SPRAY | Freq: Four times a day (QID) | RESPIRATORY_TRACT | Status: DC | PRN

## 2013-07-17 MED ORDER — SODIUM CHLORIDE 0.9 % IJ SOLN
3.0000 mL | Freq: Two times a day (BID) | INTRAMUSCULAR | Status: DC
  Administered 2013-07-18 – 2013-07-19 (×2): 3 mL via INTRAVENOUS

## 2013-07-17 MED ORDER — CIPROFLOXACIN IN D5W 400 MG/200ML IV SOLN
400.0000 mg | Freq: Two times a day (BID) | INTRAVENOUS | Status: DC
Start: 2013-07-18 — End: 2013-07-18
  Administered 2013-07-18 (×2): 400 mg via INTRAVENOUS
  Filled 2013-07-17 (×2): qty 200

## 2013-07-17 MED ORDER — ACETAMINOPHEN 650 MG RE SUPP: 650.0000 mg | Freq: Four times a day (QID) | RECTAL | Status: DC | PRN

## 2013-07-17 MED ORDER — IOHEXOL 300 MG/ML  SOLN
100.0000 mL | Freq: Once | INTRAMUSCULAR | Status: AC | PRN
  Administered 2013-07-17: 100 mL via INTRAVENOUS

## 2013-07-17 MED ORDER — SODIUM CHLORIDE 0.9 % IV SOLN
1000.0000 mL | INTRAVENOUS | Status: DC
  Administered 2013-07-17: 1000 mL via INTRAVENOUS

## 2013-07-17 MED ORDER — PANTOPRAZOLE SODIUM 40 MG IV SOLR: 40.0000 mg | Freq: Two times a day (BID) | INTRAVENOUS | Status: DC

## 2013-07-17 MED ORDER — SODIUM CHLORIDE 0.9 % IV SOLN
8.0000 mg/h | INTRAVENOUS | Status: DC
  Administered 2013-07-17: 8 mg/h via INTRAVENOUS
  Filled 2013-07-17 (×2): qty 80

## 2013-07-17 MED ORDER — ONDANSETRON HCL 4 MG PO TABS: 4.0000 mg | ORAL_TABLET | Freq: Four times a day (QID) | ORAL | Status: DC | PRN

## 2013-07-17 MED ORDER — LEVOTHYROXINE SODIUM 50 MCG PO TABS
50.0000 ug | ORAL_TABLET | Freq: Every day | ORAL | Status: DC
  Administered 2013-07-18 – 2013-07-19 (×2): 50 ug via ORAL
  Filled 2013-07-17 (×3): qty 1

## 2013-07-17 MED ORDER — PANTOPRAZOLE SODIUM 40 MG IV SOLR
40.0000 mg | Freq: Once | INTRAVENOUS | Status: AC
  Administered 2013-07-17: 40 mg via INTRAVENOUS
  Filled 2013-07-17: qty 40

## 2013-07-17 NOTE — Progress Notes (Signed)
78yo female c/o LLQ pain then passed BRBPR, H/H at baseline, CT reveals colitis, to begin IV ABX.  Will start Cipro 400mg  IV Q12H for CrCl ~50 ml/min and monitor.  Vernard Gambles, PharmD, BCPS 07/17/2013 11:30 PM

## 2013-07-17 NOTE — ED Notes (Signed)
Bed assignment is on hold per MD Toniann Fail, d/t very few available [place] beds. MD to eval and then call flow manager to proceed with placement.

## 2013-07-17 NOTE — H&P (Addendum)
[facility] History and Physical  Miranda Erickson ZOX:[id number] DOB: 1935/06/18 DOA: 07/17/2013  Referring physician: ER physician. PCP: Lorenda Peck, MD   Chief Complaint: Rectal bleeding with abdominal pain.  HPI: Miranda Erickson is a 78 y.o. female who has had the history of ischemic colitis which was proven last year in a colonoscopy presents with complaints of rectal bleeding. Patient has been having increased bowel movements since morning. By evening patient started experiencing rectal bleeding frank blood. Patient had at least 2 episodes. Each episodes where associated with crampy lower abdominal pain. Denies any nausea vomiting or fever chills. Patient denies having recently used any antibiotics of being hospitalized. At this time patient's symptoms are concerning for ischemic colitis and there is also other possible disease grade diverticular bleed. Patient denies having used any NSAIDs and is on aspirin. Patient otherwise is hemodynamically stable.  Review of Systems: As presented in the history of presenting illness, rest negative.  Past Medical History  Diagnosis Date  . Diverticulosis 11/2003    Seen on colonoscopy  . Diabetes mellitus   . Hypertension   . COPD (chronic obstructive pulmonary disease)   . 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 Mitral valve prolapse   . Hypothyroidism   . Complication of anesthesia     demerol, vomiting  . Cancer   . CHF (congestive heart failure)    Past Surgical History  Procedure Laterality Date  . Ulnar nerve repair      Decompression of ulnar and median nerve at carpal tunnel   . Mastectomy      bilateral, s/p reconstruction  . Appendectomy    . Cholecystectomy    . Tonsillectomy    . Colonoscopy  11/14/2011    Procedure: COLONOSCOPY;  Surgeon: Vertell Novak., MD;  Location: [facility] ENDOSCOPY;  Service: Endoscopy;  Laterality: N/A;   Social History:  reports that  she has been smoking Cigarettes.  She has a 22.5 pack-year smoking history. She has never used smokeless tobacco. She reports that she does not drink alcohol or use illicit drugs. Home. where does patient live-- Can do ADLs. Can patient participate in ADLs?  Allergies  Allergen Reactions  . Demerol Nausea And Vomiting  . Sulfa Antibiotics Hives    Family History  Problem Relation Age of Onset  . Breast cancer Mother     Deceased at 1  . Parkinsonism Father       Prior to Admission medications   Medication Sig Start Date End Date Taking? Authorizing Provider  albuterol (PROVENTIL HFA;VENTOLIN HFA) 108 (90 BASE) MCG/ACT inhaler Inhale 2 puffs into the lungs every 6 (six) hours as needed. Shortness of breath   Yes Historical Provider, MD  amitriptyline (ELAVIL) 100 MG tablet Take 100 mg by mouth at bedtime.   Yes Historical Provider, MD  aspirin EC 81 MG tablet Take 81 mg by mouth daily.   Yes Historical Provider, MD  atenolol (TENORMIN) 100 MG tablet Take 100 mg by mouth every morning.    Yes Historical Provider, MD  atorvastatin (LIPITOR) 20 MG tablet Take 20 mg by mouth daily.   Yes Historical Provider, MD  belladonna alk-PHENObarbital (DONNATAL) 16.2 MG tablet Take 1 tablet by mouth 2 (two) times daily as needed. For IBS   Yes Historical Provider, MD  Calcium Carbonate-Vitamin D (CALCIUM 600 + D PO) Take 1 tablet by mouth 2 (two) times daily.   Yes  Historical Provider, MD  chlorthalidone (HYGROTON) 25 MG tablet Take 25 mg by mouth daily.   Yes Historical Provider, MD  insulin NPH-regular (HUMULIN 70/30) (70-30) 100 UNIT/ML injection Inject 50 Units into the skin daily with breakfast.   Yes Historical Provider, MD  levothyroxine (SYNTHROID, LEVOTHROID) 50 MCG tablet Take 50 mcg by mouth daily.   Yes Historical Provider, MD  lisinopril (PRINIVIL,ZESTRIL) 20 MG tablet Take 20 mg by mouth daily.   Yes Historical Provider, MD  metFORMIN (GLUCOPHAGE) 1000 MG tablet Take 1,000 mg by mouth 2  (two) times daily with a meal.   Yes Historical Provider, MD  Multiple Vitamin (MULTIVITAMIN WITH MINERALS) TABS tablet Take 1 tablet by mouth daily.   Yes Historical Provider, MD   Physical Exam: Filed Vitals:   07/17/13 1830 07/17/13 1849 07/17/13 1915 07/17/13 1953  BP: 117/53 96/76 107/70 105/63  Pulse: 79  77   Temp:      TempSrc:      Resp: 22 17 22 18   SpO2: 97% 97% 96% 96%     General:  Well-developed well-nourished.  Eyes: Anicteric no pallor.  ENT: No discharge from ears eyes nose mouth.  Neck: No mass felt.  Cardiovascular: S1-S2 heard.  Respiratory: No rhonchi or crepitations.  Abdomen: Soft non-tender bowel sounds present. No guarding or rigidity.  Skin: No rash.  Musculoskeletal: No edema.  Psychiatric: Appears normal.  Neurologic: Alert and oriented to time place and person. Moves all extremities.  Labs on Admission:  Basic Metabolic Panel:  Recent Labs Lab 07/17/13 1822  NA 135  K 4.3  CL 94*  CO2 29  GLUCOSE 149*  BUN 19  CREATININE 0.93  CALCIUM 10.2   Liver Function Tests:  Recent Labs Lab 07/17/13 1822  AST 21  ALT 22  ALKPHOS 62  BILITOT 0.3  PROT 7.2  ALBUMIN 3.7    Recent Labs Lab 07/17/13 1822  LIPASE 23   No results found for this basename: AMMONIA,  in the last 168 hours CBC:  Recent Labs Lab 07/17/13 1822  WBC 6.7  NEUTROABS 4.9  HGB 12.5  HCT 36.5  MCV 91.0  PLT 255   Cardiac Enzymes: No results found for this basename: CKTOTAL, CKMB, CKMBINDEX, TROPONINI,  in the last 168 hours  BNP (last 3 results) No results found for this basename: PROBNP,  in the last 8760 hours CBG: No results found for this basename: GLUCAP,  in the last 168 hours  Radiological Exams on Admission: No results found.   Assessment/Plan Principal Problem:   Rectal bleeding Active Problems:   Diabetes mellitus type 2, controlled, with complications   Hypothyroidism   Prior ischemic Colitis   CKD (chronic kidney disease)  stage 2, GFR 60-89 ml/min   Atrial fibrillation   1. Rectal bleeding with abdominal pain - concerning for ischemic colitis. Other differentials include diverticular bleed. Less likely to be upper GI bleed. Patient will be placed on clear liquids. Get CT abdomen and pelvis. Closely follow CBC. Consult GI. Continue with gentle hydration and pain relief medications. Protonix I.V. Check stool for C. difficile. 2. Diabetes mellitus type 2 - since patient is on clear liquids I have decreased patient's dose of NPH by more than half. Closely follow CBGs. 3. Atrial fibrillation - presently rate controlled. Hold off aspirin due to GI bleed. Patient is not on Coumadin/anticoagulants reason not exactly clear. 4. Hypothyroidism - continue Synthroid. 5. History of hypertension - due to GI bleed I'm holding off patient's HCTZ and  lisinopril. Continue now for rate controlled. When necessary IV hydralazine for systolic blood pressure more than 180.    Code Status: Full code.  Family Communication: Patient's daughter at the bedside.  Disposition Plan: Admit to inpatient.    Vearl Allbaugh N. [facility] Pager [phone].  If 7PM-7AM, please contact night-coverage [email or web address] Password [facility] 07/17/2013, 9:18 PM

## 2013-07-17 NOTE — ED Provider Notes (Signed)
CSN: [id number]     Arrival date & time 07/17/13  1719 History   First MD Initiated Contact with Patient 07/17/13 1720     Chief Complaint  Patient presents with  . Abdominal Pain  . Rectal Bleeding   (Consider location/radiation/quality/duration/timing/severity/associated sxs/prior Treatment) HPI Patient presents with GI bleed. This episode began approximately 20 hours ago.  Patient awoke from sleeping with urge to defecate.  Since that time she has had multiple episodes of rectal bleeding, both with and without defecation. There is no associated abdominal pain, no lightheadedness, no syncope, no chest pain. Patient states that she has had similar events in the past multiple times. She has not seen her gastroenterologist recently. After the episodes continued to occur throughout the day, with no clear alleviating or exacerbating factors, she presents for evaluation. Past Medical History  Diagnosis Date  . Diverticulosis 11/2003    Seen on colonoscopy  . Diabetes mellitus   . Hypertension   . COPD (chronic obstructive pulmonary disease)   . 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 Mitral valve prolapse   . Hypothyroidism   . Complication of anesthesia     demerol, vomiting  . Cancer   . CHF (congestive heart failure)    Past Surgical History  Procedure Laterality Date  . Ulnar nerve repair      Decompression of ulnar and median nerve at carpal tunnel   . Mastectomy      bilateral, s/p reconstruction  . Appendectomy    . Cholecystectomy    . Tonsillectomy    . Colonoscopy  11/14/2011    Procedure: COLONOSCOPY;  Surgeon: Vertell Novak., MD;  Location: [facility] ENDOSCOPY;  Service: Endoscopy;  Laterality: N/A;   Family History  Problem Relation Age of Onset  . Breast cancer Mother     Deceased at 72  . Parkinsonism Father    History  Substance Use Topics  . Smoking status: Current Every Day Smoker -- 1.50 packs/day for 15  years    Types: Cigarettes  . Smokeless tobacco: Never Used  . Alcohol Use: No   OB History   Grav Para Term Preterm Abortions TAB SAB Ect Mult Living                 Review of Systems  Constitutional:       Per HPI, otherwise negative  HENT:       Per HPI, otherwise negative  Respiratory:       Per HPI, otherwise negative  Cardiovascular:       Per HPI, otherwise negative  Gastrointestinal: Positive for blood in stool and anal bleeding. Negative for nausea, vomiting, abdominal pain, diarrhea, constipation, abdominal distention and rectal pain.  Endocrine:       Negative aside from HPI  Genitourinary:       Neg aside from HPI   Musculoskeletal:       Per HPI, otherwise negative  Skin: Negative.   Neurological: Positive for headaches. Negative for syncope, weakness and light-headedness.    Allergies  Demerol and Sulfa antibiotics  Home Medications   Current Outpatient Rx  Name  Route  Sig  Dispense  Refill  . albuterol (PROVENTIL HFA;VENTOLIN HFA) 108 (90 BASE) MCG/ACT inhaler   Inhalation   Inhale 2 puffs into the lungs every 6 (six) hours as needed. Shortness of breath         . amitriptyline (ELAVIL) 100 MG  tablet   Oral   Take 100 mg by mouth at bedtime.         Marland Kitchen atenolol (TENORMIN) 100 MG tablet   Oral   Take 100 mg by mouth daily.         Marland Kitchen atorvastatin (LIPITOR) 20 MG tablet   Oral   Take 20 mg by mouth daily.         . calcium carbonate (OS-CAL) 600 MG TABS   Oral   Take 600 mg by mouth daily.         . chlorthalidone (HYGROTON) 25 MG tablet   Oral   Take 25 mg by mouth daily.         Marland Kitchen EXPIRED: colchicine 0.6 MG tablet   Oral   Take 1 tablet (0.6 mg total) by mouth daily.   15 tablet   0   . colchicine 0.6 MG tablet   Oral   Take 1 tablet (0.6 mg total) by mouth daily.   7 tablet   0   . insulin NPH-insulin regular (NOVOLIN 70/30) (70-30) 100 UNIT/ML injection   Subcutaneous   Inject 55 Units into the skin daily with  breakfast. Take 55 units in the morning and 45 units in the evening         . levothyroxine (SYNTHROID, LEVOTHROID) 50 MCG tablet   Oral   Take 50 mcg by mouth daily.         Marland Kitchen lisinopril (PRINIVIL,ZESTRIL) 10 MG tablet   Oral   Take 20 mg by mouth daily.          . metFORMIN (GLUCOPHAGE) 1000 MG tablet   Oral   Take 1,000 mg by mouth 2 (two) times daily with a meal.          BP 116/55  Temp(Src) 98.7 F (37.1 C) (Oral)  Resp 18  SpO2 97% Physical Exam  Nursing note and vitals reviewed. Constitutional: She is oriented to person, place, and time. She appears well-developed and well-nourished. No distress.  HENT:  Head: Normocephalic and atraumatic.  Eyes: Conjunctivae and EOM are normal.  Cardiovascular: Normal rate and regular rhythm.   Pulmonary/Chest: Effort normal and breath sounds normal. No stridor. No respiratory distress.  Abdominal: She exhibits no distension.  Genitourinary: Rectal exam shows no external hemorrhoid, no internal hemorrhoid, no fissure, no mass, no tenderness and anal tone normal.  Rectal exam was grossly negative for blood.  Musculoskeletal: She exhibits no edema.  Neurological: She is alert and oriented to person, place, and time. No cranial nerve deficit.  Skin: Skin is warm and dry.  Psychiatric: She has a normal mood and affect.    ED Course  Procedures (including critical care time) Labs Review Labs Reviewed  CBC WITH DIFFERENTIAL  COMPREHENSIVE METABOLIC PANEL  LACTIC ACID, PLASMA  PROCALCITONIN  LIPASE, BLOOD  PROTIME-INR  TYPE AND SCREEN   Imaging Review  Prior to the initial evaluation I reviewed the patient's chart, including prior visits with gastroenterology.  No results found. Pulse oximetry is 100% on room air normal EKG has atrial fibrillation with rate 80 abnormal Cardiac monitor with irregular rhythm, 75, abnormal MDM  No diagnosis found.   Update: Patient remained hemodynamically stable.  I discussed the  patient's case with her GI team.  They will follow in the morning.  [facility] gastroenterology.   Update: Patient's labs unremarkable, including stable hemoglobin.  This patient presents with one day of rectal bleeding.  On exam she is awake alert. With  the patient's history of ischemic colitis, her age, her comorbidities, she'll be admitted for further evaluation, management, serial hemoglobin values. Gastroenterology is aware of the patient and will assist with management.   Gerhard Munch, MD 07/17/13 [phone]

## 2013-07-17 NOTE — ED Notes (Signed)
Pt presents to ED via [facility]- hx of GI bleed.  States last night around 3am she noticed lower left abdominal pain follow with passing bright red blood with stool.  IV in place, a-fib on monitor hx of same, CBG 154.

## 2013-07-18 ENCOUNTER — Inpatient Hospital Stay (HOSPITAL_COMMUNITY): Payer: Medicare Other

## 2013-07-18 ENCOUNTER — Encounter (HOSPITAL_COMMUNITY): Payer: Self-pay | Admitting: Gastroenterology

## 2013-07-18 DIAGNOSIS — K625 Hemorrhage of anus and rectum: Secondary | ICD-10-CM

## 2013-07-18 DIAGNOSIS — E039 Hypothyroidism, unspecified: Secondary | ICD-10-CM

## 2013-07-18 DIAGNOSIS — J4489 Other specified chronic obstructive pulmonary disease: Secondary | ICD-10-CM

## 2013-07-18 DIAGNOSIS — I10 Essential (primary) hypertension: Secondary | ICD-10-CM

## 2013-07-18 DIAGNOSIS — J449 Chronic obstructive pulmonary disease, unspecified: Secondary | ICD-10-CM

## 2013-07-18 DIAGNOSIS — N182 Chronic kidney disease, stage 2 (mild): Secondary | ICD-10-CM

## 2013-07-18 DIAGNOSIS — K5289 Other specified noninfective gastroenteritis and colitis: Secondary | ICD-10-CM

## 2013-07-18 LAB — CBC
HCT: 33.8 % — ABNORMAL LOW (ref 36.0–46.0)
Hemoglobin: 11.3 g/dL — ABNORMAL LOW (ref 12.0–15.0)
Hemoglobin: 11.5 g/dL — ABNORMAL LOW (ref 12.0–15.0)
MCH: 31.8 pg (ref 26.0–34.0)
MCH: 31.1 pg (ref 26.0–34.0)
Platelets: 273 10*3/uL (ref 150–400)
RBC: 3.55 MIL/uL — ABNORMAL LOW (ref 3.87–5.11)
RBC: 3.7 MIL/uL — ABNORMAL LOW (ref 3.87–5.11)
WBC: 8.2 10*3/uL (ref 4.0–10.5)

## 2013-07-18 LAB — GLUCOSE, CAPILLARY
Glucose-Capillary: 164 mg/dL — ABNORMAL HIGH (ref 70–99)
Glucose-Capillary: 162 mg/dL — ABNORMAL HIGH (ref 70–99)

## 2013-07-18 LAB — BASIC METABOLIC PANEL
BUN: 15 mg/dL (ref 6–23)
CO2: 26 mEq/L (ref 19–32)
Glucose, Bld: 168 mg/dL — ABNORMAL HIGH (ref 70–99)
Potassium: 3.6 mEq/L (ref 3.5–5.1)
Sodium: 133 mEq/L — ABNORMAL LOW (ref 135–145)

## 2013-07-18 MED ORDER — IOHEXOL 350 MG/ML SOLN
80.0000 mL | Freq: Once | INTRAVENOUS | Status: AC | PRN
  Administered 2013-07-18: 19:00:00 100 mL via INTRAVENOUS

## 2013-07-18 MED ORDER — METRONIDAZOLE 500 MG PO TABS
500.0000 mg | ORAL_TABLET | Freq: Three times a day (TID) | ORAL | Status: DC
  Administered 2013-07-18 – 2013-07-19 (×4): 500 mg via ORAL
  Filled 2013-07-18 (×6): qty 1

## 2013-07-18 MED ORDER — HYDRALAZINE HCL 20 MG/ML IJ SOLN: 10.0000 mg | INTRAMUSCULAR | Status: DC | PRN

## 2013-07-18 MED ORDER — CIPROFLOXACIN HCL 500 MG PO TABS
500.0000 mg | ORAL_TABLET | Freq: Two times a day (BID) | ORAL | Status: DC
  Administered 2013-07-18 – 2013-07-19 (×2): 500 mg via ORAL
  Filled 2013-07-18 (×5): qty 1

## 2013-07-18 MED ORDER — PANTOPRAZOLE SODIUM 40 MG PO TBEC
40.0000 mg | DELAYED_RELEASE_TABLET | Freq: Two times a day (BID) | ORAL | Status: DC
  Administered 2013-07-18 – 2013-07-19 (×2): 40 mg via ORAL
  Filled 2013-07-18 (×2): qty 1

## 2013-07-18 NOTE — Consult Note (Signed)
Reason for Consult: GI bleed Referring Physician: Hospital team  Miranda Erickson is an 78 y.o. female.  HPI: Patient familiar to my partner Dr. Madilyn Fireman with this being her fourth episode of mild ischemic colitis of no obvious cause and she's currently better and wants to go home and her family history is negative and her recent CT and previous 2013 colonoscopy was reviewed and she has no other complaints  Past Medical History  Diagnosis Date  . Diverticulosis 11/2003    Seen on colonoscopy  . Diabetes mellitus   . Hypertension   . COPD (chronic obstructive pulmonary disease)   . 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 Mitral valve prolapse   . Hypothyroidism   . Complication of anesthesia     demerol, vomiting  . Cancer   . CHF (congestive heart failure)     Past Surgical History  Procedure Laterality Date  . Ulnar nerve repair      Decompression of ulnar and median nerve at carpal tunnel   . Mastectomy      bilateral, s/p reconstruction  . Appendectomy    . Cholecystectomy    . Tonsillectomy    . Colonoscopy  11/14/2011    Procedure: COLONOSCOPY;  Surgeon: Vertell Novak., MD;  Location: [facility];  Service: Endoscopy;  Laterality: N/A;    Family History  Problem Relation Age of Onset  . Breast cancer Mother     Deceased at 46  . Parkinsonism Father     Social History:  reports that she has been smoking Cigarettes.  She has a 22.5 pack-year smoking history. She has never used smokeless tobacco. She reports that she does not drink alcohol or use illicit drugs.  Allergies:  Allergies  Allergen Reactions  . Demerol Nausea And Vomiting  . Sulfa Antibiotics Hives    Medications: I have reviewed the patient's current medications.  Results for orders placed during the hospital encounter of 07/17/13 (from the past 48 hour(s))  OCCULT BLOOD, POC DEVICE     Status: Abnormal   Collection Time    07/17/13  6:10 PM       Result Value Range   Fecal Occult Bld POSITIVE (*) NEGATIVE  CBC WITH DIFFERENTIAL     Status: None   Collection Time    07/17/13  6:22 PM      Result Value Range   WBC 6.7  4.0 - 10.5 K/uL   RBC 4.01  3.87 - 5.11 MIL/uL   Hemoglobin 12.5  12.0 - 15.0 g/dL   HCT 16.1  09.6 - 04.5 %   MCV 91.0  78.0 - 100.0 fL   MCH 31.2  26.0 - 34.0 pg   MCHC 34.2  30.0 - 36.0 g/dL   RDW 40.9  81.1 - 91.4 %   Platelets 255  150 - 400 K/uL   Neutrophils Relative % 73  43 - 77 %   Neutro Abs 4.9  1.7 - 7.7 K/uL   Lymphocytes Relative 19  12 - 46 %   Lymphs Abs 1.3  0.7 - 4.0 K/uL   Monocytes Relative 7  3 - 12 %   Monocytes Absolute 0.4  0.1 - 1.0 K/uL   Eosinophils Relative 0  0 - 5 %   Eosinophils Absolute 0.0  0.0 - 0.7 K/uL   Basophils Relative 0  0 - 1 %   Basophils Absolute 0.0  0.0 -  0.1 K/uL  COMPREHENSIVE METABOLIC PANEL     Status: Abnormal   Collection Time    07/17/13  6:22 PM      Result Value Range   Sodium 135  135 - 145 mEq/L   Potassium 4.3  3.5 - 5.1 mEq/L   Chloride 94 (*) 96 - 112 mEq/L   CO2 29  19 - 32 mEq/L   Glucose, Bld 149 (*) 70 - 99 mg/dL   BUN 19  6 - 23 mg/dL   Creatinine, Ser 1.61  0.50 - 1.10 mg/dL   Calcium 09.6  8.4 - 04.5 mg/dL   Total Protein 7.2  6.0 - 8.3 g/dL   Albumin 3.7  3.5 - 5.2 g/dL   AST 21  0 - 37 U/L   ALT 22  0 - 35 U/L   Alkaline Phosphatase 62  39 - 117 U/L   Total Bilirubin 0.3  0.3 - 1.2 mg/dL   GFR calc non Af Amer 58 (*) >90 mL/min   GFR calc Af Amer 67 (*) >90 mL/min   Comment: (NOTE)     The eGFR has been calculated using the CKD EPI equation.     This calculation has not been validated in all clinical situations.     eGFR's persistently <90 mL/min signify possible Chronic Kidney     Disease.  LIPASE, BLOOD     Status: None   Collection Time    07/17/13  6:22 PM      Result Value Range   Lipase 23  11 - 59 U/L  PROTIME-INR     Status: None   Collection Time    07/17/13  6:22 PM      Result Value Range   Prothrombin Time  12.9  11.6 - 15.2 seconds   INR 0.99  0.00 - 1.49  LACTIC ACID, PLASMA     Status: None   Collection Time    07/17/13  6:24 PM      Result Value Range   Lactic Acid, Venous 2.2  0.5 - 2.2 mmol/L  TYPE AND SCREEN     Status: None   Collection Time    07/17/13  6:24 PM      Result Value Range   ABO/RH(D) A POS     Antibody Screen NEG     Sample Expiration 07/20/2013    PROCALCITONIN     Status: None   Collection Time    07/17/13  6:25 PM      Result Value Range   Procalcitonin <0.10     Comment:            Interpretation:     PCT (Procalcitonin) <= 0.5 ng/mL:     Systemic infection (sepsis) is not likely.     Local bacterial infection is possible.     (NOTE)             ICU PCT Algorithm               Non ICU PCT Algorithm        ----------------------------     ------------------------------             PCT < 0.25 ng/mL                 PCT < 0.1 ng/mL         Stopping of antibiotics            Stopping of antibiotics  strongly encouraged.               strongly encouraged.        ----------------------------     ------------------------------           PCT level decrease by               PCT < 0.25 ng/mL           >= 80% from peak PCT           OR PCT 0.25 - 0.5 ng/mL          Stopping of antibiotics                                                 encouraged.         Stopping of antibiotics               encouraged.        ----------------------------     ------------------------------           PCT level decrease by              PCT >= 0.25 ng/mL           < 80% from peak PCT            AND PCT >= 0.5 ng/mL            Continuing antibiotics                                                  encouraged.           Continuing antibiotics                encouraged.        ----------------------------     ------------------------------         PCT level increase compared          PCT > 0.5 ng/mL             with peak PCT AND              PCT >= 0.5 ng/mL              Escalation of antibiotics                                              strongly encouraged.          Escalation of antibiotics            strongly encouraged.  GLUCOSE, CAPILLARY     Status: Abnormal   Collection Time    07/17/13 10:38 PM      Result Value Range   Glucose-Capillary 130 (*) 70 - 99 mg/dL   Comment 1 Documented in Chart     Comment 2 Notify RN    CBC     Status: Abnormal   Collection Time    07/17/13 11:41 PM      Result Value Range   WBC 6.3  4.0 - 10.5 K/uL   RBC 3.77 (*) 3.87 -  5.11 MIL/uL   Hemoglobin 11.8 (*) 12.0 - 15.0 g/dL   HCT 56.2 (*) 13.0 - 86.5 %   MCV 90.7  78.0 - 100.0 fL   MCH 31.3  26.0 - 34.0 pg   MCHC 34.5  30.0 - 36.0 g/dL   RDW 78.4  69.6 - 29.5 %   Platelets 248  150 - 400 K/uL  CLOSTRIDIUM DIFFICILE BY PCR     Status: None   Collection Time    07/17/13 11:45 PM      Result Value Range   C difficile by pcr NEGATIVE  NEGATIVE  CBC     Status: Abnormal   Collection Time    07/18/13  4:40 AM      Result Value Range   WBC 8.2  4.0 - 10.5 K/uL   Comment: WHITE COUNT CONFIRMED ON SMEAR   RBC 3.55 (*) 3.87 - 5.11 MIL/uL   Hemoglobin 11.3 (*) 12.0 - 15.0 g/dL   HCT 28.4 (*) 13.2 - 44.0 %   MCV 91.3  78.0 - 100.0 fL   MCH 31.8  26.0 - 34.0 pg   MCHC 34.9  30.0 - 36.0 g/dL   RDW 10.2  72.5 - 36.6 %   Platelets 273  150 - 400 K/uL  GLUCOSE, CAPILLARY     Status: Abnormal   Collection Time    07/18/13  7:40 AM      Result Value Range   Glucose-Capillary 164 (*) 70 - 99 mg/dL   Comment 1 Documented in Chart     Comment 2 Notify RN    CBC     Status: Abnormal   Collection Time    07/18/13  8:33 AM      Result Value Range   WBC 6.5  4.0 - 10.5 K/uL   RBC 3.70 (*) 3.87 - 5.11 MIL/uL   Hemoglobin 11.5 (*) 12.0 - 15.0 g/dL   HCT 44.0 (*) 34.7 - 42.5 %   MCV 91.4  78.0 - 100.0 fL   MCH 31.1  26.0 - 34.0 pg   MCHC 34.0  30.0 - 36.0 g/dL   RDW 95.6  38.7 - 56.4 %   Platelets 236  150 - 400 K/uL  BASIC METABOLIC PANEL     Status: Abnormal    Collection Time    07/18/13  8:33 AM      Result Value Range   Sodium 133 (*) 135 - 145 mEq/L   Potassium 3.6  3.5 - 5.1 mEq/L   Chloride 96  96 - 112 mEq/L   CO2 26  19 - 32 mEq/L   Glucose, Bld 168 (*) 70 - 99 mg/dL   BUN 15  6 - 23 mg/dL   Creatinine, Ser 3.32  0.50 - 1.10 mg/dL   Calcium 8.8  8.4 - 95.1 mg/dL   GFR calc non Af Amer 64 (*) >90 mL/min   GFR calc Af Amer 74 (*) >90 mL/min   Comment: (NOTE)     The eGFR has been calculated using the CKD EPI equation.     This calculation has not been validated in all clinical situations.     eGFR's persistently <90 mL/min signify possible Chronic Kidney     Disease.    Ct Abdomen Pelvis W Contrast  07/17/2013   CLINICAL DATA:  Left-sided abdominal pain. Diarrhea. Rectal bleeding.  EXAM: CT ABDOMEN AND PELVIS WITH CONTRAST  TECHNIQUE: Multidetector CT imaging of the abdomen and pelvis was performed using the standard protocol  following bolus administration of intravenous contrast.  CONTRAST:  OMNIPAQUE IOHEXOL 300 MG/ML  SOLN  COMPARISON:  01/07/2013.  FINDINGS: BODY WALL: Unremarkable.  LOWER CHEST:  Mediastinum: Unremarkable.  Lungs/pleura: No consolidation.  ABDOMEN/PELVIS:  Liver: No focal abnormality.  Biliary: Cholecystectomy which accounts for mild CBD enlargement.  Pancreas: Scattered coarse calcifications, likely chronic pancreatitis.  Spleen: Unremarkable.  Adrenals: Unremarkable.  Kidneys and ureters: 7 mm stone in the lower pole right kidney, nonobstructive.  Bladder: Unremarkable.  Bowel: Circumferential thickening of the mid and distal descending colon and sigmoid colon. Mild pericolonic fat haziness. No perforation or abscess. No pericecal inflammatory changes. No obstruction. Distal colonic diverticulosis. Large, but noninflamed mid duodenal diverticulum.  Retroperitoneum: No mass or adenopathy.  Peritoneum: No free fluid or gas.  Reproductive: Unremarkable.  Vascular: No acute abnormality. There is atherosclerosis of the  aorta and branch vessels, but the SMA and IMA are patent.  OSSEOUS: No acute abnormalities. No suspicious lytic or blastic lesions.  IMPRESSION: 1. Descending and sigmoid colitis which could be infectious, inflammatory, or from nonocclusive ischemia (SMA and IMA patent).  2.  Chronic findings are stable from priors.   Electronically Signed   By: Tiburcio Pea   On: 07/17/2013 22:21    ROS negative except above and her bowels are normal without blood in between these spells Blood pressure 106/59, pulse 80, temperature 98.5 F (36.9 C), temperature source Oral, resp. rate 20, height 5\' 2"  (1.575 m), weight 89.9 kg (198 lb 3.1 oz), SpO2 92.00%. Physical Exam vital signs stable afebrile no acute distress abdomen is soft nontender good bowel sounds labs and CT reviewed minimal pedal edema with adequate peripheral pulses  Assessment/Plan: Recurrent ischemic colitis plan: We'll proceed with an MRA to see if vascular surgery or interventional radiology as needed otherwise will advance diet and hopefully will be able to go home soon and even if MRA is unrevealing she might need surgical options if this continues and my partner Dr. Madilyn Fireman happy to see back as an outpatient  [facility] E 07/18/2013, 12:15 PM

## 2013-07-18 NOTE — Progress Notes (Signed)
Brief Nutrition Note:   RD pulled to chart for malnutrition screening tool.  Pt indicated unsure of any unintentional weight loss.  Chart reviewed, weight hx shows 10 lb weight gain over the past several months.   Wt Readings from Last 5 Encounters:  07/17/13 198 lb 3.1 oz (89.9 kg)  01/07/13 188 lb (85.276 kg)  11/16/11 203 lb 7.8 oz (92.3 kg)  11/16/11 203 lb 7.8 oz (92.3 kg)   Body mass index is 36.24 kg/(m^2). Obesity class 2  Diet: Heart Healthy  Chart reviewed, no additional nutrition interventions warranted at this time. Please consult as needed.   Isabell Jarvis RD, LDN Pager [phone] After Hours pager [phone]

## 2013-07-18 NOTE — Progress Notes (Signed)
Patient expressed desire to be a DNR status.  Informed MD.  MD stated will address in AM during AM rounding when able to have full conversation with patient.  RN explained to patient that with DNR status no chest compressions will take place, no medicine intervention, and no intubation.  Patient verbalized understanding and stated "when it's my time to go, I want to go." Patient currently full code per previous order.  Patient resting comfortably.  Will continue to monitor.  Barrie Lyme RN 12:31 AM 07/18/2013

## 2013-07-18 NOTE — Progress Notes (Signed)
[facility] PROGRESS NOTE  Miranda Erickson ACZ:[id number] DOB: 16-Jul-1935 DOA: 07/17/2013 PCP: Lorenda Peck, MD  Assessment/Plan: 1. Rectal bleeding with abdominal pain  - GI consulted and reportedly due to ischemic colitis based on patient's history. - Plan is for MRA and advancement of diet.   - Patient to f/u with Dr. Madilyn Fireman once discharged  2. Diabetes mellitus type 2  -  Continue current NPH dose  - Closely follow CBGs.   3. Atrial fibrillation  - presently rate controlled. Hold off aspirin due to GI bleed. Patient is not on Coumadin/anticoagulants most likely due to recurrent GI bleeds   4. Hypothyroidism - continue Synthroid.   5. History of hypertension  - Currently on atenolol, BP relatively well controlled.   Code Status: full Family Communication: No family at bedside Disposition Plan: Pending cessation of GI bleed and improved po intake.   Consultants:  GI  Procedures:  MRA  Antibiotics:  Cipro and flagyl  HPI/Subjective: Patient has no new complaints. No acute issues overnight.  Objective: Filed Vitals:   07/18/13 1317  BP: 119/73  Pulse: 68  Temp: 97.5 F (36.4 C)  Resp: 18    Intake/Output Summary (Last 24 hours) at 07/18/13 1415 Last data filed at 07/18/13 1300  Gross per 24 hour  Intake 1875.76 ml  Output    200 ml  Net 1675.76 ml   Filed Weights   07/17/13 2240  Weight: 89.9 kg (198 lb 3.1 oz)    Exam:   General:  Pt in NAD, Alert and Awake  Cardiovascular: irregularly irregular, no mrg  Respiratory: CTA BL, no wheezes  Abdomen: soft, NT, ND  Musculoskeletal: no cyanosis or clubbing   Data Reviewed: Basic Metabolic Panel:  Recent Labs Lab 07/17/13 1822 07/18/13 0833  NA 135 133*  K 4.3 3.6  CL 94* 96  CO2 29 26  GLUCOSE 149* 168*  BUN 19 15  CREATININE 0.93 0.86  CALCIUM 10.2 8.8   Liver Function Tests:  Recent Labs Lab 07/17/13 1822  AST 21  ALT 22  ALKPHOS 62  BILITOT 0.3  PROT 7.2   ALBUMIN 3.7    Recent Labs Lab 07/17/13 1822  LIPASE 23   No results found for this basename: AMMONIA,  in the last 168 hours CBC:  Recent Labs Lab 07/17/13 1822 07/17/13 2341 07/18/13 0440 07/18/13 0833  WBC 6.7 6.3 8.2 6.5  NEUTROABS 4.9  --   --   --   HGB 12.5 11.8* 11.3* 11.5*  HCT 36.5 34.2* 32.4* 33.8*  MCV 91.0 90.7 91.3 91.4  PLT 255 248 273 236   Cardiac Enzymes: No results found for this basename: CKTOTAL, CKMB, CKMBINDEX, TROPONINI,  in the last 168 hours BNP (last 3 results) No results found for this basename: PROBNP,  in the last 8760 hours CBG:  Recent Labs Lab 07/17/13 2238 07/18/13 0740 07/18/13 1206  GLUCAP 130* 164* 140*    Recent Results (from the past 240 hour(s))  CLOSTRIDIUM DIFFICILE BY PCR     Status: None   Collection Time    07/17/13 11:45 PM      Result Value Range Status   C difficile by pcr NEGATIVE  NEGATIVE Final     Studies: Ct Abdomen Pelvis W Contrast  07/17/2013   CLINICAL DATA:  Left-sided abdominal pain. Diarrhea. Rectal bleeding.  EXAM: CT ABDOMEN AND PELVIS WITH CONTRAST  TECHNIQUE: Multidetector CT imaging of the abdomen and pelvis was performed using the standard protocol following bolus  administration of intravenous contrast.  CONTRAST:  OMNIPAQUE IOHEXOL 300 MG/ML  SOLN  COMPARISON:  01/07/2013.  FINDINGS: BODY WALL: Unremarkable.  LOWER CHEST:  Mediastinum: Unremarkable.  Lungs/pleura: No consolidation.  ABDOMEN/PELVIS:  Liver: No focal abnormality.  Biliary: Cholecystectomy which accounts for mild CBD enlargement.  Pancreas: Scattered coarse calcifications, likely chronic pancreatitis.  Spleen: Unremarkable.  Adrenals: Unremarkable.  Kidneys and ureters: 7 mm stone in the lower pole right kidney, nonobstructive.  Bladder: Unremarkable.  Bowel: Circumferential thickening of the mid and distal descending colon and sigmoid colon. Mild pericolonic fat haziness. No perforation or abscess. No pericecal inflammatory  changes. No obstruction. Distal colonic diverticulosis. Large, but noninflamed mid duodenal diverticulum.  Retroperitoneum: No mass or adenopathy.  Peritoneum: No free fluid or gas.  Reproductive: Unremarkable.  Vascular: No acute abnormality. There is atherosclerosis of the aorta and branch vessels, but the SMA and IMA are patent.  OSSEOUS: No acute abnormalities. No suspicious lytic or blastic lesions.  IMPRESSION: 1. Descending and sigmoid colitis which could be infectious, inflammatory, or from nonocclusive ischemia (SMA and IMA patent).  2.  Chronic findings are stable from priors.   Electronically Signed   By: Tiburcio Pea   On: 07/17/2013 22:21    Scheduled Meds: . amitriptyline  100 mg Oral QHS  . atenolol  100 mg Oral Daily  . atorvastatin  20 mg Oral Daily  . ciprofloxacin  500 mg Oral BID  . insulin aspart  0-9 Units Subcutaneous TID WC  . insulin NPH  15 Units Subcutaneous QAC breakfast  . levothyroxine  50 mcg Oral QAC breakfast  . metroNIDAZOLE  500 mg Oral Q8H  . pantoprazole  40 mg Oral BID  . sodium chloride  3 mL Intravenous Q12H   Continuous Infusions: . sodium chloride 100 mL/hr at 07/18/13 0005    Principal Problem:   Rectal bleeding Active Problems:   Diabetes mellitus type 2, controlled, with complications   Hypothyroidism   Prior ischemic Colitis   CKD (chronic kidney disease) stage 2, GFR 60-89 ml/min   Atrial fibrillation    Time spent: > 35 minutes    [name]  [facility] Pager [phone] If 7PM-7AM, please contact night-coverage at [email or web address], password [facility] 07/18/2013, 2:15 PM  LOS: 1 day

## 2013-07-18 NOTE — Progress Notes (Signed)
Pharmacy: IV protonix, ciprofloxacin and metronidazole changed from IV to PO per protocol. Herby Abraham, Pharm.D. [phone] 07/18/2013 2:09 PM

## 2013-07-19 LAB — BASIC METABOLIC PANEL
Calcium: 8.7 mg/dL (ref 8.4–10.5)
Creatinine, Ser: 0.9 mg/dL (ref 0.50–1.10)
GFR calc Af Amer: 70 mL/min — ABNORMAL LOW (ref >90)
GFR calc non Af Amer: 61 mL/min — ABNORMAL LOW (ref >90)
Sodium: 136 mEq/L (ref 135–145)

## 2013-07-19 LAB — CBC WITH DIFFERENTIAL/PLATELET
Basophils Absolute: 0 10*3/uL (ref 0.0–0.1)
Basophils Relative: 0 % (ref 0–1)
Eosinophils Relative: 3 % (ref 0–5)
HCT: 32.8 % — ABNORMAL LOW (ref 36.0–46.0)
MCHC: 33.8 g/dL (ref 30.0–36.0)
MCV: 92.1 fL (ref 78.0–100.0)
Monocytes Absolute: 0.5 10*3/uL (ref 0.1–1.0)
Platelets: 231 10*3/uL (ref 150–400)
RDW: 13.4 % (ref 11.5–15.5)

## 2013-07-19 MED ORDER — METRONIDAZOLE 500 MG PO TABS: 500.0000 mg | ORAL_TABLET | Freq: Three times a day (TID) | ORAL | Status: DC

## 2013-07-19 MED ORDER — ONDANSETRON HCL 4 MG PO TABS: 4.0000 mg | ORAL_TABLET | Freq: Four times a day (QID) | ORAL | Status: AC | PRN

## 2013-07-19 MED ORDER — PANTOPRAZOLE SODIUM 40 MG PO TBEC: 40.0000 mg | DELAYED_RELEASE_TABLET | Freq: Two times a day (BID) | ORAL | Status: DC

## 2013-07-19 MED ORDER — CIPROFLOXACIN HCL 500 MG PO TABS: 500.0000 mg | ORAL_TABLET | Freq: Two times a day (BID) | ORAL | Status: DC

## 2013-07-19 NOTE — Progress Notes (Signed)
Patient discharged home with daughter. Patient given discharge instructions and prescriptions. Patient was told to contact the doctor with questions and concerns. Patient was told of f/u appointments. Patient was stable upon discharge.

## 2013-07-19 NOTE — Discharge Summary (Signed)
Physician Discharge Summary  Miranda Erickson JXB:[id number] DOB: May 16, 1935 DOA: 07/17/2013  PCP: Lorenda Peck, MD  Admit date: 07/17/2013 Discharge date: 07/19/2013  Time spent: > 35 minutes  Recommendations for Outpatient Follow-up:  1. Please check h/h 2. F/u with blood pressures and adjust medications if necessary 3. Please decide when it is appropriate to continue ASA.  Discharge Diagnoses:  Principal Problem:   Rectal bleeding Active Problems:   Diabetes mellitus type 2, controlled, with complications   Hypothyroidism   Prior ischemic Colitis   CKD (chronic kidney disease) stage 2, GFR 60-89 ml/min   Atrial fibrillation   Discharge Condition: stable  Diet recommendation: diabetic diet  Filed Weights   07/17/13 2240 07/18/13 2015  Weight: 89.9 kg (198 lb 3.1 oz) 89.9 kg (198 lb 3.1 oz)    History of present illness:  Pt is a 83 with h/o ischemic colitis proven last year 2013 in colonoscopy who presented with painless GI bleed.  Hospital Course:   1. Rectal bleeding with abdominal pain  - GI consulted and reportedly due to ischemic colitis based on patient's history.  - CT of abdomen essentially with no new findings other than roughly 50% narrowing of the proximal celiac axis  . Please see below. - Patient to f/u with Dr. Madilyn Fireman once discharged also reportedly should consider resection of affected area given that this is the fourth time that patient has this problem. - discharge on bactrim and cipro.  2. Diabetes mellitus type 2  - continue home regimen - diabetic diet.  3. Atrial fibrillation  - presently rate controlled. Hold off aspirin due to GI bleed. - Patient is not on Coumadin/anticoagulants most likely due to recurrent GI bleeds  - patient reports that she is going to see her pcp for further evaluation and recommendations at that time should discuss anticoagulation if patient able to continue give that she has had four bouts of GI bleeds.  4.  Hypothyroidism - continue Synthroid. Stable.  5. History of hypertension  - Currently on atenolol, BP relatively well controlled. As such will plan on discharging on this regimen.  Procedures:  None   Consultations:  GI: Dr. Ewing Schlein  Discharge Exam: Filed Vitals:   07/19/13 1332  BP: 132/63  Pulse: 73  Temp: 98.6 F (37 C)  Resp: 18    General: Pt in NAD, alert and awake Cardiovascular: irregular irregular, no mrg Respiratory: CTA BL, no wheezes  Discharge Instructions  Discharge Orders   Future Orders Complete By Expires   Call MD for:  extreme fatigue  As directed    Call MD for:  persistant dizziness or light-headedness  As directed    Call MD for:  severe uncontrolled pain  As directed    Call MD for:  temperature >100.4  As directed    Diet - low sodium heart healthy  As directed    Discharge instructions  As directed    Comments:     F/u with Dr. Madilyn Fireman in 2-3 weeks   Increase activity slowly  As directed        Medication List    STOP taking these medications       aspirin EC 81 MG tablet     chlorthalidone 25 MG tablet  Commonly known as:  HYGROTON     lisinopril 20 MG tablet  Commonly known as:  PRINIVIL,ZESTRIL      TAKE these medications       albuterol 108 (90 BASE) MCG/ACT inhaler  Commonly known as:  PROVENTIL HFA;VENTOLIN HFA  Inhale 2 puffs into the lungs every 6 (six) hours as needed. Shortness of breath     amitriptyline 100 MG tablet  Commonly known as:  ELAVIL  Take 100 mg by mouth at bedtime.     atenolol 100 MG tablet  Commonly known as:  TENORMIN  Take 100 mg by mouth every morning.     atorvastatin 20 MG tablet  Commonly known as:  LIPITOR  Take 20 mg by mouth daily.     belladonna alk-PHENObarbital 16.2 MG tablet  Commonly known as:  DONNATAL  Take 1 tablet by mouth 2 (two) times daily as needed. For IBS     CALCIUM 600 + D PO  Take 1 tablet by mouth 2 (two) times daily.     ciprofloxacin 500 MG tablet  Commonly  known as:  CIPRO  Take 1 tablet (500 mg total) by mouth 2 (two) times daily.     HUMULIN 70/30 (70-30) 100 UNIT/ML injection  Generic drug:  insulin NPH-regular  Inject 50 Units into the skin daily with breakfast.     levothyroxine 50 MCG tablet  Commonly known as:  SYNTHROID, LEVOTHROID  Take 50 mcg by mouth daily.     metFORMIN 1000 MG tablet  Commonly known as:  GLUCOPHAGE  Take 1,000 mg by mouth 2 (two) times daily with a meal.     metroNIDAZOLE 500 MG tablet  Commonly known as:  FLAGYL  Take 1 tablet (500 mg total) by mouth every 8 (eight) hours.     multivitamin with minerals Tabs tablet  Take 1 tablet by mouth daily.     ondansetron 4 MG tablet  Commonly known as:  ZOFRAN  Take 1 tablet (4 mg total) by mouth every 6 (six) hours as needed for nausea.     pantoprazole 40 MG tablet  Commonly known as:  PROTONIX  Take 1 tablet (40 mg total) by mouth 2 (two) times daily.       Allergies  Allergen Reactions  . Demerol Nausea And Vomiting  . Sulfa Antibiotics Hives      The results of significant diagnostics from this hospitalization (including imaging, microbiology, ancillary and laboratory) are listed below for reference.    Significant Diagnostic Studies: Ct Abdomen Pelvis W Contrast  07/17/2013   CLINICAL DATA:  Left-sided abdominal pain. Diarrhea. Rectal bleeding.  EXAM: CT ABDOMEN AND PELVIS WITH CONTRAST  TECHNIQUE: Multidetector CT imaging of the abdomen and pelvis was performed using the standard protocol following bolus administration of intravenous contrast.  CONTRAST:  OMNIPAQUE IOHEXOL 300 MG/ML  SOLN  COMPARISON:  01/07/2013.  FINDINGS: BODY WALL: Unremarkable.  LOWER CHEST:  Mediastinum: Unremarkable.  Lungs/pleura: No consolidation.  ABDOMEN/PELVIS:  Liver: No focal abnormality.  Biliary: Cholecystectomy which accounts for mild CBD enlargement.  Pancreas: Scattered coarse calcifications, likely chronic pancreatitis.  Spleen: Unremarkable.  Adrenals:  Unremarkable.  Kidneys and ureters: 7 mm stone in the lower pole right kidney, nonobstructive.  Bladder: Unremarkable.  Bowel: Circumferential thickening of the mid and distal descending colon and sigmoid colon. Mild pericolonic fat haziness. No perforation or abscess. No pericecal inflammatory changes. No obstruction. Distal colonic diverticulosis. Large, but noninflamed mid duodenal diverticulum.  Retroperitoneum: No mass or adenopathy.  Peritoneum: No free fluid or gas.  Reproductive: Unremarkable.  Vascular: No acute abnormality. There is atherosclerosis of the aorta and branch vessels, but the SMA and IMA are patent.  OSSEOUS: No acute abnormalities. No suspicious lytic or blastic  lesions.  IMPRESSION: 1. Descending and sigmoid colitis which could be infectious, inflammatory, or from nonocclusive ischemia (SMA and IMA patent).  2.  Chronic findings are stable from priors.   Electronically Signed   By: Tiburcio Pea   On: 07/17/2013 22:21   Ct Angio Abd/pel W/ And/or W/o  07/18/2013   CLINICAL DATA:  Recurrent ischemic colitis.  EXAM: CT ANGIOGRAPHY ABDOMEN AND PELVIS WITH CONTRAST  TECHNIQUE: Multidetector CT imaging of the abdomen and pelvis was performed using the standard protocol during bolus administration of intravenous contrast. Multiplanar reconstructed images including MIPs were obtained and reviewed to evaluate the vascular anatomy.  CONTRAST:  OMNIPAQUE IOHEXOL 350 MG/ML SOLN  COMPARISON:  CT of the abdomen and pelvis on 07/17/2013.  FINDINGS: There is roughly 50% narrowing at the origin of the celiac axis. The superior mesenteric and inferior mesenteric arteries show normal patency. The abdominal aorta shows no evidence of aneurysmal disease.  Single right and 2 separate left renal arteries show normal patency. No significant iliac arterial occlusive disease is identified. Common femoral arteries and femoral bifurcations are normally patent.  Solid organs are unremarkable. Calcifications  associated with the pancreas which may relate to previous pancreatitis. There is no evidence of acute pancreatitis. Bowel loops are unremarkable. No abnormal fluid collections are seen. No masses or enlarged lymph nodes are seen.  Review of the MIP images confirms the above findings.  IMPRESSION: No evidence of significant mesenteric arterial occlusive disease. There is roughly 50% narrowing of the proximal celiac axis. The mesenteric arteries are normally patent.   Electronically Signed   By: Irish Lack   On: 07/18/2013 21:47    Microbiology: Recent Results (from the past 240 hour(s))  CLOSTRIDIUM DIFFICILE BY PCR     Status: None   Collection Time    07/17/13 11:45 PM      Result Value Range Status   C difficile by pcr NEGATIVE  NEGATIVE Final     Labs: Basic Metabolic Panel:  Recent Labs Lab 07/17/13 1822 07/18/13 0833 07/19/13 0506  NA 135 133* 136  K 4.3 3.6 3.5  CL 94* 96 98  CO2 29 26 27   GLUCOSE 149* 168* 142*  BUN 19 15 12   CREATININE 0.93 0.86 0.90  CALCIUM 10.2 8.8 8.7   Liver Function Tests:  Recent Labs Lab 07/17/13 1822  AST 21  ALT 22  ALKPHOS 62  BILITOT 0.3  PROT 7.2  ALBUMIN 3.7    Recent Labs Lab 07/17/13 1822  LIPASE 23   No results found for this basename: AMMONIA,  in the last 168 hours CBC:  Recent Labs Lab 07/17/13 1822 07/17/13 2341 07/18/13 0440 07/18/13 0833 07/19/13 0506  WBC 6.7 6.3 8.2 6.5 5.6  NEUTROABS 4.9  --   --   --  3.3  HGB 12.5 11.8* 11.3* 11.5* 11.1*  HCT 36.5 34.2* 32.4* 33.8* 32.8*  MCV 91.0 90.7 91.3 91.4 92.1  PLT 255 248 273 236 231   Cardiac Enzymes: No results found for this basename: CKTOTAL, CKMB, CKMBINDEX, TROPONINI,  in the last 168 hours BNP: BNP (last 3 results) No results found for this basename: PROBNP,  in the last 8760 hours CBG:  Recent Labs Lab 07/18/13 1206 07/18/13 1703 07/18/13 2026 07/19/13 0743 07/19/13 1208  GLUCAP 140* 162* 217* 148* 175*       Signed:  [name]  [facility] 07/19/2013, 1:57 PM

## 2014-04-25 ENCOUNTER — Encounter: Payer: Self-pay | Admitting: *Deleted

## 2014-06-24 ENCOUNTER — Emergency Department (HOSPITAL_COMMUNITY): Payer: Medicare Other

## 2014-06-24 ENCOUNTER — Encounter (HOSPITAL_COMMUNITY): Payer: Self-pay | Admitting: Emergency Medicine

## 2014-06-24 ENCOUNTER — Inpatient Hospital Stay (HOSPITAL_COMMUNITY)
Admission: EM | Admit: 2014-06-24 | Discharge: 2014-06-29 | DRG: 393 | Disposition: A | Discharge status: Home / Self Care | Payer: Medicare Other | Attending: Internal Medicine | Admitting: Internal Medicine

## 2014-06-24 DIAGNOSIS — K5289 Other specified noninfective gastroenteritis and colitis: Secondary | ICD-10-CM

## 2014-06-24 DIAGNOSIS — K589 Irritable bowel syndrome without diarrhea: Secondary | ICD-10-CM | POA: Diagnosis present

## 2014-06-24 DIAGNOSIS — Z794 Long term (current) use of insulin: Secondary | ICD-10-CM | POA: Diagnosis not present

## 2014-06-24 DIAGNOSIS — E119 Type 2 diabetes mellitus without complications: Secondary | ICD-10-CM | POA: Diagnosis present

## 2014-06-24 DIAGNOSIS — Z79899 Other long term (current) drug therapy: Secondary | ICD-10-CM

## 2014-06-24 DIAGNOSIS — N182 Chronic kidney disease, stage 2 (mild): Secondary | ICD-10-CM | POA: Diagnosis present

## 2014-06-24 DIAGNOSIS — I129 Hypertensive chronic kidney disease with stage 1 through stage 4 chronic kidney disease, or unspecified chronic kidney disease: Secondary | ICD-10-CM | POA: Diagnosis present

## 2014-06-24 DIAGNOSIS — I059 Rheumatic mitral valve disease, unspecified: Secondary | ICD-10-CM | POA: Diagnosis present

## 2014-06-24 DIAGNOSIS — I774 Celiac artery compression syndrome: Secondary | ICD-10-CM | POA: Diagnosis present

## 2014-06-24 DIAGNOSIS — D72829 Elevated white blood cell count, unspecified: Secondary | ICD-10-CM | POA: Diagnosis present

## 2014-06-24 DIAGNOSIS — I509 Heart failure, unspecified: Secondary | ICD-10-CM | POA: Diagnosis present

## 2014-06-24 DIAGNOSIS — J4489 Other specified chronic obstructive pulmonary disease: Secondary | ICD-10-CM | POA: Diagnosis present

## 2014-06-24 DIAGNOSIS — J441 Chronic obstructive pulmonary disease with (acute) exacerbation: Secondary | ICD-10-CM

## 2014-06-24 DIAGNOSIS — K529 Noninfective gastroenteritis and colitis, unspecified: Secondary | ICD-10-CM

## 2014-06-24 DIAGNOSIS — I4891 Unspecified atrial fibrillation: Secondary | ICD-10-CM | POA: Diagnosis present

## 2014-06-24 DIAGNOSIS — I5031 Acute diastolic (congestive) heart failure: Secondary | ICD-10-CM | POA: Diagnosis present

## 2014-06-24 DIAGNOSIS — K625 Hemorrhage of anus and rectum: Secondary | ICD-10-CM | POA: Diagnosis present

## 2014-06-24 DIAGNOSIS — F17200 Nicotine dependence, unspecified, uncomplicated: Secondary | ICD-10-CM | POA: Diagnosis present

## 2014-06-24 DIAGNOSIS — E039 Hypothyroidism, unspecified: Secondary | ICD-10-CM | POA: Diagnosis present

## 2014-06-24 DIAGNOSIS — J449 Chronic obstructive pulmonary disease, unspecified: Secondary | ICD-10-CM | POA: Diagnosis present

## 2014-06-24 DIAGNOSIS — J9600 Acute respiratory failure, unspecified whether with hypoxia or hypercapnia: Secondary | ICD-10-CM | POA: Diagnosis present

## 2014-06-24 DIAGNOSIS — K921 Melena: Secondary | ICD-10-CM | POA: Diagnosis present

## 2014-06-24 DIAGNOSIS — I482 Chronic atrial fibrillation, unspecified: Secondary | ICD-10-CM

## 2014-06-24 DIAGNOSIS — K559 Vascular disorder of intestine, unspecified: Secondary | ICD-10-CM | POA: Diagnosis present

## 2014-06-24 DIAGNOSIS — I10 Essential (primary) hypertension: Secondary | ICD-10-CM | POA: Diagnosis present

## 2014-06-24 DIAGNOSIS — E118 Type 2 diabetes mellitus with unspecified complications: Secondary | ICD-10-CM | POA: Diagnosis present

## 2014-06-24 LAB — CBC
HCT: 39.9 % (ref 36.0–46.0)
HCT: 38.2 % (ref 36.0–46.0)
Hemoglobin: 13.3 g/dL (ref 12.0–15.0)
Hemoglobin: 12.7 g/dL (ref 12.0–15.0)
MCH: 30.7 pg (ref 26.0–34.0)
MCH: 30.5 pg (ref 26.0–34.0)
MCHC: 33.3 g/dL (ref 30.0–36.0)
MCHC: 33.2 g/dL (ref 30.0–36.0)
MCV: 92.1 fL (ref 78.0–100.0)
MCV: 91.8 fL (ref 78.0–100.0)
PLATELETS: 210 10*3/uL (ref 150–400)
Platelets: 211 10*3/uL (ref 150–400)
RBC: 4.33 MIL/uL (ref 3.87–5.11)
RBC: 4.16 MIL/uL (ref 3.87–5.11)
RDW: 13.1 % (ref 11.5–15.5)
RDW: 13.2 % (ref 11.5–15.5)
WBC: 11 10*3/uL — ABNORMAL HIGH (ref 4.0–10.5)
WBC: 9.9 10*3/uL (ref 4.0–10.5)

## 2014-06-24 LAB — TSH: TSH: 1.14 u[IU]/mL (ref 0.350–4.500)

## 2014-06-24 LAB — GLUCOSE, CAPILLARY
Glucose-Capillary: 149 mg/dL — ABNORMAL HIGH (ref 70–99)
Glucose-Capillary: 161 mg/dL — ABNORMAL HIGH (ref 70–99)
Glucose-Capillary: 175 mg/dL — ABNORMAL HIGH (ref 70–99)

## 2014-06-24 LAB — HEMOGLOBIN AND HEMATOCRIT, BLOOD
HCT: 36.1 % (ref 36.0–46.0)
HEMOGLOBIN: 12.1 g/dL (ref 12.0–15.0)

## 2014-06-24 LAB — BASIC METABOLIC PANEL
Anion gap: 14 (ref 5–15)
BUN: 19 mg/dL (ref 6–23)
CALCIUM: 9.3 mg/dL (ref 8.4–10.5)
CO2: 26 meq/L (ref 19–32)
Chloride: 97 mEq/L (ref 96–112)
Creatinine, Ser: 1 mg/dL (ref 0.50–1.10)
GFR calc Af Amer: 61 mL/min — ABNORMAL LOW (ref >90)
GFR, EST NON AFRICAN AMERICAN: 53 mL/min — AB (ref >90)
GLUCOSE: 275 mg/dL — AB (ref 70–99)
Potassium: 4.2 mEq/L (ref 3.7–5.3)
Sodium: 137 mEq/L (ref 137–147)

## 2014-06-24 LAB — TYPE AND SCREEN
ABO/RH(D): A POS
Antibody Screen: NEGATIVE

## 2014-06-24 LAB — I-STAT CG4 LACTIC ACID, ED: LACTIC ACID, VENOUS: 2.17 mmol/L (ref 0.5–2.2)

## 2014-06-24 LAB — CBG MONITORING, ED: Glucose-Capillary: 262 mg/dL — ABNORMAL HIGH (ref 70–99)

## 2014-06-24 MED ORDER — ALBUTEROL SULFATE (2.5 MG/3ML) 0.083% IN NEBU
2.5000 mg | INHALATION_SOLUTION | Freq: Four times a day (QID) | RESPIRATORY_TRACT | Status: DC | PRN
  Administered 2014-06-25: 2.5 mg via RESPIRATORY_TRACT
  Filled 2014-06-24: qty 3

## 2014-06-24 MED ORDER — METRONIDAZOLE IN NACL 5-0.79 MG/ML-% IV SOLN
500.0000 mg | Freq: Three times a day (TID) | INTRAVENOUS | Status: DC
  Administered 2014-06-24 – 2014-06-28 (×12): 500 mg via INTRAVENOUS
  Filled 2014-06-24 (×13): qty 100

## 2014-06-24 MED ORDER — SODIUM CHLORIDE 0.9 % IV SOLN
INTRAVENOUS | Status: DC
  Administered 2014-06-24 – 2014-06-26 (×2): via INTRAVENOUS

## 2014-06-24 MED ORDER — METRONIDAZOLE IN NACL 5-0.79 MG/ML-% IV SOLN
500.0000 mg | Freq: Once | INTRAVENOUS | Status: AC
  Administered 2014-06-24: 500 mg via INTRAVENOUS
  Filled 2014-06-24: qty 100

## 2014-06-24 MED ORDER — IPRATROPIUM-ALBUTEROL 0.5-2.5 (3) MG/3ML IN SOLN
3.0000 mL | Freq: Once | RESPIRATORY_TRACT | Status: AC
  Administered 2014-06-24: 3 mL via RESPIRATORY_TRACT
  Filled 2014-06-24: qty 6

## 2014-06-24 MED ORDER — MORPHINE SULFATE 4 MG/ML IJ SOLN
4.0000 mg | Freq: Once | INTRAMUSCULAR | Status: AC
  Administered 2014-06-24: 4 mg via INTRAVENOUS
  Filled 2014-06-24: qty 1

## 2014-06-24 MED ORDER — INSULIN ASPART PROT & ASPART (70-30 MIX) 100 UNIT/ML ~~LOC~~ SUSP
10.0000 [IU] | Freq: Two times a day (BID) | SUBCUTANEOUS | Status: DC
  Filled 2014-06-24: qty 10

## 2014-06-24 MED ORDER — ONDANSETRON HCL 4 MG/2ML IJ SOLN
4.0000 mg | Freq: Once | INTRAMUSCULAR | Status: AC
  Administered 2014-06-24: 4 mg via INTRAVENOUS
  Filled 2014-06-24: qty 2

## 2014-06-24 MED ORDER — ONDANSETRON HCL 4 MG/2ML IJ SOLN: 4.0000 mg | Freq: Four times a day (QID) | INTRAMUSCULAR | Status: DC | PRN

## 2014-06-24 MED ORDER — PANTOPRAZOLE SODIUM 40 MG IV SOLR
40.0000 mg | INTRAVENOUS | Status: DC
  Administered 2014-06-24 – 2014-06-27 (×4): 40 mg via INTRAVENOUS
  Filled 2014-06-24 (×5): qty 40

## 2014-06-24 MED ORDER — ONDANSETRON HCL 4 MG PO TABS: 4.0000 mg | ORAL_TABLET | Freq: Four times a day (QID) | ORAL | Status: DC | PRN

## 2014-06-24 MED ORDER — AMITRIPTYLINE HCL 100 MG PO TABS
100.0000 mg | ORAL_TABLET | Freq: Every day | ORAL | Status: DC
  Administered 2014-06-24 – 2014-06-28 (×5): 100 mg via ORAL
  Filled 2014-06-24 (×6): qty 1

## 2014-06-24 MED ORDER — CIPROFLOXACIN IN D5W 400 MG/200ML IV SOLN
400.0000 mg | Freq: Two times a day (BID) | INTRAVENOUS | Status: DC
  Administered 2014-06-24 – 2014-06-28 (×8): 400 mg via INTRAVENOUS
  Filled 2014-06-24 (×8): qty 200

## 2014-06-24 MED ORDER — IPRATROPIUM-ALBUTEROL 0.5-2.5 (3) MG/3ML IN SOLN
3.0000 mL | Freq: Four times a day (QID) | RESPIRATORY_TRACT | Status: DC
  Administered 2014-06-24: 3 mL via RESPIRATORY_TRACT
  Filled 2014-06-24: qty 3

## 2014-06-24 MED ORDER — IOHEXOL 300 MG/ML  SOLN
25.0000 mL | Freq: Once | INTRAMUSCULAR | Status: AC | PRN
  Administered 2014-06-24: 25 mL via ORAL

## 2014-06-24 MED ORDER — ALPRAZOLAM 0.5 MG PO TABS: 0.5000 mg | ORAL_TABLET | Freq: Every evening | ORAL | Status: DC | PRN

## 2014-06-24 MED ORDER — DOCUSATE SODIUM 100 MG PO CAPS
300.0000 mg | ORAL_CAPSULE | Freq: Every day | ORAL | Status: DC
Start: 2014-06-24 — End: 2014-06-25
  Administered 2014-06-24: 300 mg via ORAL
  Filled 2014-06-24: qty 3
  Filled 2014-06-24: qty 4

## 2014-06-24 MED ORDER — MORPHINE SULFATE 2 MG/ML IJ SOLN
1.0000 mg | INTRAMUSCULAR | Status: DC | PRN
  Administered 2014-06-24 – 2014-06-27 (×9): 2 mg via INTRAVENOUS
  Filled 2014-06-24 (×11): qty 1

## 2014-06-24 MED ORDER — IOHEXOL 300 MG/ML  SOLN
100.0000 mL | Freq: Once | INTRAMUSCULAR | Status: AC | PRN
  Administered 2014-06-24: 100 mL via INTRAVENOUS

## 2014-06-24 MED ORDER — ALBUTEROL SULFATE (2.5 MG/3ML) 0.083% IN NEBU: 2.5000 mg | INHALATION_SOLUTION | Freq: Four times a day (QID) | RESPIRATORY_TRACT | Status: DC | PRN

## 2014-06-24 MED ORDER — CIPROFLOXACIN IN D5W 200 MG/100ML IV SOLN
200.0000 mg | Freq: Once | INTRAVENOUS | Status: AC
  Administered 2014-06-24: 200 mg via INTRAVENOUS
  Filled 2014-06-24 (×2): qty 100

## 2014-06-24 MED ORDER — ALBUTEROL SULFATE HFA 108 (90 BASE) MCG/ACT IN AERS: 2.0000 | INHALATION_SPRAY | Freq: Four times a day (QID) | RESPIRATORY_TRACT | Status: DC | PRN

## 2014-06-24 MED ORDER — SODIUM CHLORIDE 0.9 % IV BOLUS (SEPSIS)
1000.0000 mL | Freq: Once | INTRAVENOUS | Status: AC
  Administered 2014-06-24: 1000 mL via INTRAVENOUS

## 2014-06-24 MED ORDER — LEVOTHYROXINE SODIUM 50 MCG PO TABS
50.0000 ug | ORAL_TABLET | Freq: Every day | ORAL | Status: DC
  Administered 2014-06-24 – 2014-06-29 (×6): 50 ug via ORAL
  Filled 2014-06-24 (×6): qty 1

## 2014-06-24 MED ORDER — INSULIN ASPART 100 UNIT/ML ~~LOC~~ SOLN
0.0000 [IU] | Freq: Three times a day (TID) | SUBCUTANEOUS | Status: DC
  Administered 2014-06-24: 1 [IU] via SUBCUTANEOUS
  Administered 2014-06-24: 2 [IU] via SUBCUTANEOUS
  Administered 2014-06-25: 1 [IU] via SUBCUTANEOUS
  Administered 2014-06-25 – 2014-06-27 (×5): 2 [IU] via SUBCUTANEOUS
  Administered 2014-06-28: 7 [IU] via SUBCUTANEOUS
  Administered 2014-06-28: 5 [IU] via SUBCUTANEOUS
  Administered 2014-06-28: 3 [IU] via SUBCUTANEOUS
  Administered 2014-06-29: 5 [IU] via SUBCUTANEOUS
  Administered 2014-06-29: 3 [IU] via SUBCUTANEOUS

## 2014-06-24 MED ORDER — ALBUTEROL SULFATE HFA 108 (90 BASE) MCG/ACT IN AERS
2.0000 | INHALATION_SPRAY | Freq: Four times a day (QID) | RESPIRATORY_TRACT | Status: DC | PRN
  Filled 2014-06-24: qty 6.7

## 2014-06-24 MED ORDER — PAROXETINE HCL 10 MG PO TABS
10.0000 mg | ORAL_TABLET | Freq: Every day | ORAL | Status: DC
  Administered 2014-06-24 – 2014-06-29 (×6): 10 mg via ORAL
  Filled 2014-06-24 (×6): qty 1

## 2014-06-24 NOTE — ED Notes (Signed)
Pt remains in CT at this time.  

## 2014-06-24 NOTE — ED Notes (Signed)
CT notified that the pt has finished her contrast.  

## 2014-06-24 NOTE — ED Provider Notes (Signed)
CSN: [id number]     Arrival date & time 06/24/14  0120 History   First MD Initiated Contact with Patient 06/24/14 0207     Chief Complaint  Patient presents with  . 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 She is s/p cholecystectomy, appendectomy - remotely.   She comes in today with hematochezia. This began at 2200. Patient initially had some blood streaked diarrhea followed by straight BRBPR - oozing. She feels generally weak. Denies sob and cp,. Endorses a couple of episodes of previous sx.   Past Medical History  Diagnosis Date  . Diverticulosis 11/2003    Seen on colonoscopy  . Diabetes mellitus   . Hypertension   . COPD (chronic obstructive pulmonary disease)   . 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 Mitral valve prolapse   . Hypothyroidism   . Complication of anesthesia     demerol, vomiting  . Cancer   . CHF (congestive heart failure)    Past Surgical History  Procedure Laterality Date  . Ulnar nerve repair      Decompression of ulnar and median nerve at carpal tunnel   . Mastectomy      bilateral, s/p reconstruction  . Appendectomy    . Cholecystectomy    . Tonsillectomy    . Colonoscopy  11/14/2011    Procedure: COLONOSCOPY;  Surgeon: Winfield Cunas., MD;  Location: [facility] ENDOSCOPY;  Service: Endoscopy;  Laterality: N/A;   Family History  Problem Relation Age of Onset  . Breast cancer Mother     Deceased at 57  . Parkinsonism Father    History  Substance Use Topics  . Smoking status: Current Every Day Smoker -- 1.50 packs/day for 15 years    Types: Cigarettes  . Smokeless tobacco: Never Used  . Alcohol Use: No   OB History   Grav Para Term Preterm Abortions TAB SAB Ect Mult Living                  Review of Systems  10 point review of symptoms obtained and is negative with the exceptions of symptoms noted abov.e   Allergies  Demerol and Sulfa antibiotics  Home Medications   Prior to Admission medications   Medication Sig Start Date End Date Taking? Authorizing Provider  albuterol (PROVENTIL HFA;VENTOLIN HFA) 108 (90 BASE) MCG/ACT inhaler Inhale 2 puffs into the lungs every 6 (six) hours as needed. Shortness of breath    Historical Provider, MD  amitriptyline (ELAVIL) 100 MG tablet Take 100 mg by mouth at bedtime.    Historical Provider, MD  atenolol (TENORMIN) 100 MG tablet Take 100 mg by mouth every morning.     Historical Provider, MD  atorvastatin (LIPITOR) 20 MG tablet Take 20 mg by mouth daily.    Historical Provider, MD  belladonna alk-PHENObarbital (DONNATAL) 16.2 MG tablet Take 1 tablet by mouth 2 (two) times daily as needed. For IBS    Historical Provider, MD  Calcium Carbonate-Vitamin D (CALCIUM 600 + D PO) Take 1 tablet by mouth 2 (two) times daily.    Historical Provider, MD  ciprofloxacin (CIPRO) 500 MG tablet Take 1 tablet (500 mg total) by mouth 2 (two) times daily. 07/19/13   Velvet Bathe, MD  insulin NPH-regular (HUMULIN 70/30) (70-30) 100 UNIT/ML  injection Inject 50 Units into the skin daily with breakfast.    Historical Provider, MD  levothyroxine (SYNTHROID, LEVOTHROID) 50 MCG tablet Take 50 mcg by mouth daily.    Historical Provider, MD  metFORMIN (GLUCOPHAGE) 1000 MG tablet Take 1,000 mg by mouth 2 (two) times daily with a meal.    Historical Provider, MD  metroNIDAZOLE (FLAGYL) 500 MG tablet Take 1 tablet (500 mg total) by mouth every 8 (eight) hours. 07/19/13   Velvet Bathe, MD  Multiple Vitamin (MULTIVITAMIN WITH MINERALS) TABS tablet Take 1 tablet by mouth daily.    Historical Provider, MD  ondansetron (ZOFRAN) 4 MG tablet Take 1 tablet (4 mg total) by mouth every 6 (six) hours as needed for nausea. 07/19/13   Velvet Bathe, MD  pantoprazole (PROTONIX) 40 MG  tablet Take 1 tablet (40 mg total) by mouth 2 (two) times daily. 07/19/13   Velvet Bathe, MD   Ht '5\' 2"'  (1.575 m)  Wt 210 lb (95.255 kg)  BMI 38.40 kg/m2 Physical Exam  Gen: well nourished and well developed appearing Head: NCAT Ears: normal to inspection Nose: normal to inspection, no epistaxis or drainage Mouth: oral mucsoa is well hydrated appearing, normal posterior oropharynx Neck: supple, no stridor CV: RRR, no murmur, palpable peripheral pulses Resp: lung sounds are clear to auscultation bilaterally, no wheeing or rhonchi or rales, normal respiratory effort.  Abd: soft, tender over the lower abdomen without peritoneal signs, nondistended Extremities: normal to inspection.  Skin: warm and dry Neuro: CN ii - XII, no focal deficitis Psyche; normal affect, cooperative.   ED Course  Procedures (including critical care time) Labs Review  Results for orders placed during the hospital encounter of 06/24/14 (from the past 24 hour(s))  CBG MONITORING, ED     Status: Abnormal   Collection Time    06/24/14  1:45 AM      Result Value Ref Range   Glucose-Capillary 262 (*) 70 - 99 mg/dL  TYPE AND SCREEN     Status: None   Collection Time    06/24/14  2:08 AM      Result Value Ref Range   ABO/RH(D) A POS     Antibody Screen NEG     Sample Expiration 06/27/2014    CBC     Status: Abnormal   Collection Time    06/24/14  2:33 AM      Result Value Ref Range   WBC 11.0 (*) 4.0 - 10.5 K/uL   RBC 4.33  3.87 - 5.11 MIL/uL   Hemoglobin 13.3  12.0 - 15.0 g/dL   HCT 39.9  36.0 - 46.0 %   MCV 92.1  78.0 - 100.0 fL   MCH 30.7  26.0 - 34.0 pg   MCHC 33.3  30.0 - 36.0 g/dL   RDW 13.1  11.5 - 15.5 %   Platelets 210  150 - 400 K/uL  BASIC METABOLIC PANEL     Status: Abnormal   Collection Time    06/24/14  2:33 AM      Result Value Ref Range   Sodium 137  137 - 147 mEq/L   Potassium 4.2  3.7 - 5.3 mEq/L   Chloride 97  96 - 112 mEq/L   CO2 26  19 - 32 mEq/L   Glucose, Bld 275 (*) 70 -  99 mg/dL   BUN 19  6 - 23 mg/dL   Creatinine, Ser 1.00  0.50 - 1.10 mg/dL   Calcium 9.3  8.4 - 10.5 mg/dL  GFR calc non Af Amer 53 (*) >90 mL/min   GFR calc Af Amer 61 (*) >90 mL/min   Anion gap 14  5 - 15  I-STAT CG4 LACTIC ACID, ED     Status: None   Collection Time    06/24/14  4:07 AM      Result Value Ref Range   Lactic Acid, Venous 2.17  0.5 - 2.2 mmol/L  CBC     Status: None   Collection Time    06/24/14  7:59 AM      Result Value Ref Range   WBC 9.9  4.0 - 10.5 K/uL   RBC 4.16  3.87 - 5.11 MIL/uL   Hemoglobin 12.7  12.0 - 15.0 g/dL   HCT 38.2  36.0 - 46.0 %   MCV 91.8  78.0 - 100.0 fL   MCH 30.5  26.0 - 34.0 pg   MCHC 33.2  30.0 - 36.0 g/dL   RDW 13.2  11.5 - 15.5 %   Platelets 211  150 - 400 K/uL   CT Abdomen Pelvis W Contrast (Final result)  Result time: 06/24/14 07:33:11    Final result by Rad Results In Interface (06/24/14 07:33:11)    Narrative:   CLINICAL DATA: Abdominal pain, rectal bleeding.  EXAM: CT ABDOMEN AND PELVIS WITH CONTRAST  TECHNIQUE: Multidetector CT imaging of the abdomen and pelvis was performed using the standard protocol following bolus administration of intravenous contrast.  CONTRAST: 157m OMNIPAQUE IOHEXOL 300 MG/ML SOLN  COMPARISON: CT scan of July 17, 2013.  FINDINGS: Visualized lung bases appear normal. No significant osseous abnormality is noted.  Status post cholecystectomy. The liver, spleen and pancreas appear normal. Adrenal glands and kidneys appear normal. 9 mm nonobstructive calculus is noted in lower pole collecting system of right kidney. No hydronephrosis or renal obstruction is noted. Atherosclerotic calcifications of abdominal aorta are noted without aneurysm formation. Severe wall thickening with surrounding inflammation is seen involving the splenic flexure as well as the descending and sigmoid colon consistent with colitis. Patient reportedly status post appendectomy. No small bowel obstruction  is noted. No abnormal fluid collection is noted. Urinary bladder appears normal. No significant adenopathy is noted. Uterus and ovaries appear normal.  IMPRESSION: 9 mm nonobstructive calculus seen in the right kidney.  Severe wall thickening with surrounding inflammation is seen involving the splenic flexure and descending and sigmoid colon consistent with colitis.   Electronically Signed By: JSabino DickM.D. On: 06/24/2014 07:33         MDM    Patient with colitis but, no signs of ischemic distribution on CT scan, no pneumotosis and normal lactic acid level.  We are rechecking CBC. We will tx empirically with Cipro and Flagyl. Case discussed with Dr. aKarleen Hampshirewho will admit to Tele Unit.    JElyn Peers MD 06/24/14 [phone]

## 2014-06-24 NOTE — ED Notes (Signed)
Patient presents via EMS stating she has approximately 4 bouts of rectal bleeding and vomited 4- 5 times (yellowish).  Assisted up to the [facility] and had a mixture of urine and bleeding.

## 2014-06-24 NOTE — Consult Note (Signed)
[facility] CONSULT Reason for consult: Abdominal Pain and Rectal Bleeding Referring Physician: [facility]. PCP: Dr. Janeice Robinson. Primary G.I.: [facility]  Miranda Erickson is an 79 y.o. female.  HPI: she has been seen by our practice for some time. Over the past couple leaders she's had several hospitalizations forward is felt to be ischemic colitis. I performed her on colonoscopy in 2013 which revealed active colitis in the descending colon and appeared to be ischemic colitis although the pathology is nonspecific. Since that time, she has been admitted to his with lower abdominal pain and bloody stools with the same presentation of left sided abdominal pain of fairly sudden onset with bloody diarrhea. On admission 3/14 another 9/14. On each occasion CT scan showed segment will colitis and the splenic flexure proximal descending: area . CT angiogram 9/14 should 50% stenosis of the celiac artery with normal flow in the SMA and IMA. The patient notes that sudden onset of abdominal pain followed by diarrhea that became bloody. CT scan was repeated and again shows severe inflammation in the splenic flexure in ascending and sigmoid colon. Patient feels better with pain medication.   she takes stool softener is an adamantly denies chronic constipation. She does have hypertension COPD as well as a history of past congestive heart failure. She has had previous cholecystectomy in appendectomy. She is diabetic and take since:, hypothyroid, takes belladonna chronically for IBS symptoms.  Past Medical History  Diagnosis Date  . Diverticulosis 11/2003    Seen on colonoscopy  . Diabetes mellitus   . Hypertension   . COPD (chronic obstructive pulmonary disease)   . 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 Mitral valve prolapse   . Hypothyroidism   . Complication of anesthesia     demerol, vomiting  . Cancer   . CHF (congestive heart  failure)     Past Surgical History  Procedure Laterality Date  . Ulnar nerve repair      Decompression of ulnar and median nerve at carpal tunnel   . Mastectomy      bilateral, s/p reconstruction  . Appendectomy    . Cholecystectomy    . Tonsillectomy    . Colonoscopy  11/14/2011    Procedure: COLONOSCOPY;  Surgeon: Winfield Cunas., MD;  Location: [facility] ENDOSCOPY;  Service: Endoscopy;  Laterality: N/A;    Family History  Problem Relation Age of Onset  . Breast cancer Mother     Deceased at 9  . Parkinsonism Father     Social History:  reports that she has been smoking Cigarettes.  She has a 22.5 pack-year smoking history. She has never used smokeless tobacco. She reports that she does not drink alcohol or use illicit drugs.  Allergies:  Allergies  Allergen Reactions  . Demerol Nausea And Vomiting  . Sulfa Antibiotics Hives    Medications; Prior to Admission medications   Medication Sig Start Date End Date Taking? Authorizing Provider  ALPRAZolam Duanne Moron) 0.5 MG tablet Take 0.5 mg by mouth at bedtime as needed for anxiety.   Yes Historical Provider, MD  amitriptyline (ELAVIL) 100 MG tablet Take 100 mg by mouth at bedtime.   Yes Historical Provider, MD  atenolol (TENORMIN) 100 MG tablet Take 100 mg by mouth every morning.    Yes Historical Provider, MD  atorvastatin (LIPITOR) 20 MG tablet Take 20 mg by mouth daily.   Yes Historical Provider, MD  belladonna alk-PHENObarbital (  DONNATAL) 16.2 MG tablet Take 1 tablet by mouth 2 (two) times daily as needed. For IBS   Yes Historical Provider, MD  Calcium Carbonate-Vitamin D (CALCIUM 600 + D PO) Take 1 tablet by mouth 2 (two) times daily.   Yes Historical Provider, MD  docusate sodium (COLACE) 100 MG capsule Take 300-400 mg by mouth daily.   Yes Historical Provider, MD  insulin NPH-regular (HUMULIN 70/30) (70-30) 100 UNIT/ML injection Inject 30-50 Units into the skin 2 (two) times daily. 50 units in the morning, and 30 units in the  evening   Yes Historical Provider, MD  levothyroxine (SYNTHROID, LEVOTHROID) 50 MCG tablet Take 50 mcg by mouth daily.   Yes Historical Provider, MD  metFORMIN (GLUCOPHAGE) 1000 MG tablet Take 1,000 mg by mouth 2 (two) times daily with a meal.   Yes Historical Provider, MD  Multiple Vitamin (MULTIVITAMIN WITH MINERALS) TABS tablet Take 1 tablet by mouth daily.   Yes Historical Provider, MD  ondansetron (ZOFRAN) 4 MG tablet Take 1 tablet (4 mg total) by mouth every 6 (six) hours as needed for nausea. 07/19/13  Yes Velvet Bathe, MD  pantoprazole (PROTONIX) 40 MG tablet Take 1 tablet (40 mg total) by mouth 2 (two) times daily. 07/19/13  Yes Velvet Bathe, MD  PARoxetine (PAXIL) 10 MG tablet Take 10 mg by mouth daily.   Yes Historical Provider, MD  PRESCRIPTION MEDICATION Take 1 tablet by mouth daily. furosemide   Yes Historical Provider, MD  albuterol (PROVENTIL HFA;VENTOLIN HFA) 108 (90 BASE) MCG/ACT inhaler Inhale 2 puffs into the lungs every 6 (six) hours as needed. Shortness of breath    Historical Provider, MD   . amitriptyline  100 mg Oral QHS  . ciprofloxacin  200 mg Intravenous Once  . docusate sodium  300-400 mg Oral Daily  . insulin aspart  0-9 Units Subcutaneous TID WC  . levothyroxine  50 mcg Oral Daily  . PARoxetine  10 mg Oral Daily   PRN Meds albuterol, ALPRAZolam, morphine injection, ondansetron (ZOFRAN) IV, ondansetron Results for orders placed during the hospital encounter of 06/24/14 (from the past 48 hour(s))  CBG MONITORING, ED     Status: Abnormal   Collection Time    06/24/14  1:45 AM      Result Value Ref Range   Glucose-Capillary 262 (*) 70 - 99 mg/dL  TYPE AND SCREEN     Status: None   Collection Time    06/24/14  2:08 AM      Result Value Ref Range   ABO/RH(D) A POS     Antibody Screen NEG     Sample Expiration 06/27/2014    CBC     Status: Abnormal   Collection Time    06/24/14  2:33 AM      Result Value Ref Range   WBC 11.0 (*) 4.0 - 10.5 K/uL   RBC 4.33   3.87 - 5.11 MIL/uL   Hemoglobin 13.3  12.0 - 15.0 g/dL   HCT 39.9  36.0 - 46.0 %   MCV 92.1  78.0 - 100.0 fL   MCH 30.7  26.0 - 34.0 pg   MCHC 33.3  30.0 - 36.0 g/dL   RDW 13.1  11.5 - 15.5 %   Platelets 210  150 - 400 K/uL  BASIC METABOLIC PANEL     Status: Abnormal   Collection Time    06/24/14  2:33 AM      Result Value Ref Range   Sodium 137  137 - 147  mEq/L   Potassium 4.2  3.7 - 5.3 mEq/L   Chloride 97  96 - 112 mEq/L   CO2 26  19 - 32 mEq/L   Glucose, Bld 275 (*) 70 - 99 mg/dL   BUN 19  6 - 23 mg/dL   Creatinine, Ser 1.00  0.50 - 1.10 mg/dL   Calcium 9.3  8.4 - 10.5 mg/dL   GFR calc non Af Amer 53 (*) >90 mL/min   GFR calc Af Amer 61 (*) >90 mL/min   Comment: (NOTE)     The eGFR has been calculated using the CKD EPI equation.     This calculation has not been validated in all clinical situations.     eGFR's persistently <90 mL/min signify possible Chronic Kidney     Disease.   Anion gap 14  5 - 15  I-STAT CG4 LACTIC ACID, ED     Status: None   Collection Time    06/24/14  4:07 AM      Result Value Ref Range   Lactic Acid, Venous 2.17  0.5 - 2.2 mmol/L  CBC     Status: None   Collection Time    06/24/14  7:59 AM      Result Value Ref Range   WBC 9.9  4.0 - 10.5 K/uL   RBC 4.16  3.87 - 5.11 MIL/uL   Hemoglobin 12.7  12.0 - 15.0 g/dL   HCT 38.2  36.0 - 46.0 %   MCV 91.8  78.0 - 100.0 fL   MCH 30.5  26.0 - 34.0 pg   MCHC 33.2  30.0 - 36.0 g/dL   RDW 13.2  11.5 - 15.5 %   Platelets 211  150 - 400 K/uL  GLUCOSE, CAPILLARY     Status: Abnormal   Collection Time    06/24/14 12:03 PM      Result Value Ref Range   Glucose-Capillary 149 (*) 70 - 99 mg/dL    Ct Abdomen Pelvis W Contrast  06/24/2014   CLINICAL DATA:  Abdominal pain, rectal bleeding.  EXAM: CT ABDOMEN AND PELVIS WITH CONTRAST  TECHNIQUE: Multidetector CT imaging of the abdomen and pelvis was performed using the standard protocol following bolus administration of intravenous contrast.  CONTRAST:  164m  OMNIPAQUE IOHEXOL 300 MG/ML  SOLN  COMPARISON:  CT scan of July 17, 2013.  FINDINGS: Visualized lung bases appear normal. No significant osseous abnormality is noted.  Status post cholecystectomy. The liver, spleen and pancreas appear normal. Adrenal glands and kidneys appear normal. 9 mm nonobstructive calculus is noted in lower pole collecting system of right kidney. No hydronephrosis or renal obstruction is noted. Atherosclerotic calcifications of abdominal aorta are noted without aneurysm formation. Severe wall thickening with surrounding inflammation is seen involving the splenic flexure as well as the descending and sigmoid colon consistent with colitis. Patient reportedly status post appendectomy. No small bowel obstruction is noted. No abnormal fluid collection is noted. Urinary bladder appears normal. No significant adenopathy is noted. Uterus and ovaries appear normal.  IMPRESSION: 9 mm nonobstructive calculus seen in the right kidney.  Severe wall thickening with surrounding inflammation is seen involving the splenic flexure and descending and sigmoid colon consistent with colitis.   Electronically Signed   By: JSabino DickM.D.   On: 06/24/2014 07:33               Blood pressure 120/69, pulse 78, resp. rate 17, height '5\' 2"'  (1.575 m), weight 95.255 kg (210 lb), SpO2 94.00%.  Physical exam:   General-- someone obese white female in no acute distress Heart-- regular rate and rhythm without murmurs are gallops  Lungs-- clear Abdomen-- none distended with minimal bowel sounds tender primarily on the left side, without rebound..    Assessment: 1. Recurrent ischemic colitis. The patient has minimal blockages remain mesenteric trunks. To does not appear that she is chronically constipated. She is diabetic and has vascular disease may well be that medications are playing an issue as well. 2. Multiple chronic medical problems as noted above  Plan: 1. Continued supportive therapy  as you are.  2. When patient is improved, would begin clear liquids and Miralax. I think we should repeat colonoscopy/sigmoidoscopy to obtain pathological confirmation of ischemic colitis.   will follow her with you.    Lycan Davee JR,Melvyn Hommes L 06/24/2014, 12:26 PM

## 2014-06-24 NOTE — H&P (Signed)
[facility] History and Physical  Miranda Erickson YSA:[id number] DOB: 1934-12-07 DOA: 06/24/2014  Referring physician: EDP PCP: Myriam Jacobson, MD   Chief Complaint: nausea, vomiting and abdominal pain since Friday night.   HPI: Miranda Erickson is a 79 y.o. female with prior h/o hypertension, diabetes mellitus, ischemic colitis in the past, atrial fibrillation not on coumadin or other anticoagulants, comes in for persistent nausea, vomiting and abdominal pain since Friday night. Abdominal pain is int he middle, left upper and lower quadrant, with some hematochezia . She denies fever or chills. No diarrhea. On arrival to ED, her hemoglobin is found to be 13.3, where her baseline is around 11. Ct abdomen showed inflammation of the splenic flexure, descending and sigmoid colon, lactic acid levels normal. Pain is better controlle dwith 1 mg of morphine in ED. She is referred to medical service for admission . GI consulted.    Review of Systems:  Constitutional:  No weight loss, night sweats, Fevers, chills, fatigue.  HEENT:  No headaches, Difficulty swallowing,Tooth/dental problems,Sore throat,  No sneezing, itching, ear ache, nasal congestion, post nasal drip,  Cardio-vascular:  No chest pain, Orthopnea, PND, swelling in lower extremities, anasarca, dizziness, palpitations  GI:  Nausea, vomiting and abdomin al pain since Friday night.   Resp:  Sob and wheezing.  Skin:  no rash or lesions.  GU:  no dysuria, change in color of urine, no urgency or frequency. No flank pain.  Musculoskeletal:  No joint pain or swelling. No decreased range of motion. No back pain.  Psych:  No change in mood or affect. No depression or anxiety. No memory loss.   Past Medical History  Diagnosis Date  . Diverticulosis 11/2003    Seen on colonoscopy  . Diabetes mellitus   . Hypertension   . COPD (chronic obstructive pulmonary disease)   . 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 Mitral valve prolapse   . Hypothyroidism   . Complication of anesthesia     demerol, vomiting  . Cancer   . CHF (congestive heart failure)    Past Surgical History  Procedure Laterality Date  . Ulnar nerve repair      Decompression of ulnar and median nerve at carpal tunnel   . Mastectomy      bilateral, s/p reconstruction  . Appendectomy    . Cholecystectomy    . Tonsillectomy    . Colonoscopy  11/14/2011    Procedure: COLONOSCOPY;  Surgeon: Winfield Cunas., MD;  Location: [facility] ENDOSCOPY;  Service: Endoscopy;  Laterality: N/A;   Social History:  reports that she has been smoking Cigarettes.  She has a 22.5 pack-year smoking history. She has never used smokeless tobacco. She reports that she does not drink alcohol or use illicit drugs.  Allergies  Allergen Reactions  . Demerol Nausea And Vomiting  . Sulfa Antibiotics Hives    Family History  Problem Relation Age of Onset  . Breast cancer Mother     Deceased at 7  . Parkinsonism Father      Prior to Admission medications   Medication Sig Start Date End Date Taking? Authorizing Provider  ALPRAZolam Duanne Moron) 0.5 MG tablet Take 0.5 mg by mouth at bedtime as needed for anxiety.   Yes Historical Provider, MD  amitriptyline (ELAVIL) 100 MG tablet Take 100 mg by mouth at bedtime.   Yes Historical Provider, MD  atenolol (TENORMIN) 100 MG tablet Take  100 mg by mouth every morning.    Yes Historical Provider, MD  atorvastatin (LIPITOR) 20 MG tablet Take 20 mg by mouth daily.   Yes Historical Provider, MD  belladonna alk-PHENObarbital (DONNATAL) 16.2 MG tablet Take 1 tablet by mouth 2 (two) times daily as needed. For IBS   Yes Historical Provider, MD  Calcium Carbonate-Vitamin D (CALCIUM 600 + D PO) Take 1 tablet by mouth 2 (two) times daily.   Yes Historical Provider, MD  docusate sodium (COLACE) 100 MG capsule Take 300-400 mg by mouth daily.   Yes Historical Provider, MD  insulin NPH-regular  (HUMULIN 70/30) (70-30) 100 UNIT/ML injection Inject 30-50 Units into the skin 2 (two) times daily. 50 units in the morning, and 30 units in the evening   Yes Historical Provider, MD  levothyroxine (SYNTHROID, LEVOTHROID) 50 MCG tablet Take 50 mcg by mouth daily.   Yes Historical Provider, MD  metFORMIN (GLUCOPHAGE) 1000 MG tablet Take 1,000 mg by mouth 2 (two) times daily with a meal.   Yes Historical Provider, MD  Multiple Vitamin (MULTIVITAMIN WITH MINERALS) TABS tablet Take 1 tablet by mouth daily.   Yes Historical Provider, MD  ondansetron (ZOFRAN) 4 MG tablet Take 1 tablet (4 mg total) by mouth every 6 (six) hours as needed for nausea. 07/19/13  Yes Velvet Bathe, MD  pantoprazole (PROTONIX) 40 MG tablet Take 1 tablet (40 mg total) by mouth 2 (two) times daily. 07/19/13  Yes Velvet Bathe, MD  PARoxetine (PAXIL) 10 MG tablet Take 10 mg by mouth daily.   Yes Historical Provider, MD  PRESCRIPTION MEDICATION Take 1 tablet by mouth daily. furosemide   Yes Historical Provider, MD  albuterol (PROVENTIL HFA;VENTOLIN HFA) 108 (90 BASE) MCG/ACT inhaler Inhale 2 puffs into the lungs every 6 (six) hours as needed. Shortness of breath    Historical Provider, MD   Physical Exam: Filed Vitals:   06/24/14 0530 06/24/14 0721 06/24/14 0923 06/24/14 1033  BP: 157/85 131/77 137/82 120/69  Pulse: 25 85 84 78  Resp: '25  18 17  ' Height:      Weight:      SpO2: 94%  98% 94%    Wt Readings from Last 3 Encounters:  06/24/14 95.255 kg (210 lb)  07/18/13 89.9 kg (198 lb 3.1 oz)  01/07/13 85.276 kg (188 lb)    General:  Appears calm and comfortable Eyes: PERRL, normal lids, irises & conjunctiva Neck: no LAD, masses or thyromegaly Cardiovascular: RRR, no m/r/g. Trace edema present.  Respiratory: CTA bilaterally, no w/r/r. Normal respiratory effort. Abdomen: soft, mild tenderness in the umbilical and left upper quadrant and left lower quadrant. Bs+, no signs of peritonitis.  Skin: no rash or induration seen on  limited exam Musculoskeletal: grossly normal tone BUE/BLE Neurologic: grossly non-focal.          Labs on Admission:  Basic Metabolic Panel:  Recent Labs Lab 06/24/14 0233  NA 137  K 4.2  CL 97  CO2 26  GLUCOSE 275*  BUN 19  CREATININE 1.00  CALCIUM 9.3   Liver Function Tests: No results found for this basename: AST, ALT, ALKPHOS, BILITOT, PROT, ALBUMIN,  in the last 168 hours No results found for this basename: LIPASE, AMYLASE,  in the last 168 hours No results found for this basename: AMMONIA,  in the last 168 hours CBC:  Recent Labs Lab 06/24/14 0233 06/24/14 0759  WBC 11.0* 9.9  HGB 13.3 12.7  HCT 39.9 38.2  MCV 92.1 91.8  PLT 210 211  Cardiac Enzymes: No results found for this basename: CKTOTAL, CKMB, CKMBINDEX, TROPONINI,  in the last 168 hours  BNP (last 3 results) No results found for this basename: PROBNP,  in the last 8760 hours CBG:  Recent Labs Lab 06/24/14 0145  GLUCAP 262*    Radiological Exams on Admission: Ct Abdomen Pelvis W Contrast  06/24/2014   CLINICAL DATA:  Abdominal pain, rectal bleeding.  EXAM: CT ABDOMEN AND PELVIS WITH CONTRAST  TECHNIQUE: Multidetector CT imaging of the abdomen and pelvis was performed using the standard protocol following bolus administration of intravenous contrast.  CONTRAST:  136m OMNIPAQUE IOHEXOL 300 MG/ML  SOLN  COMPARISON:  CT scan of July 17, 2013.  FINDINGS: Visualized lung bases appear normal. No significant osseous abnormality is noted.  Status post cholecystectomy. The liver, spleen and pancreas appear normal. Adrenal glands and kidneys appear normal. 9 mm nonobstructive calculus is noted in lower pole collecting system of right kidney. No hydronephrosis or renal obstruction is noted. Atherosclerotic calcifications of abdominal aorta are noted without aneurysm formation. Severe wall thickening with surrounding inflammation is seen involving the splenic flexure as well as the descending and sigmoid  colon consistent with colitis. Patient reportedly status post appendectomy. No small bowel obstruction is noted. No abnormal fluid collection is noted. Urinary bladder appears normal. No significant adenopathy is noted. Uterus and ovaries appear normal.  IMPRESSION: 9 mm nonobstructive calculus seen in the right kidney.  Severe wall thickening with surrounding inflammation is seen involving the splenic flexure and descending and sigmoid colon consistent with colitis.   Electronically Signed   By: JSabino DickM.D.   On: 06/24/2014 07:33    EKG: sinus with   Assessment/Plan Active Problems:   Prior ischemic Colitis   Recurrent ischemic colitis/ infectious colitis: Admitted to telemetry. Started her on IV cipro and flagyl. Gentle hydration, NPO except for meds and ice chips. IV ptotonix. Repeat H&H tonight, and transfuse to keep hemoglobin >7. Her baseline hemoglobin is around 11.  Gastroenterology consult to evaluate for colonoscopy.  Multiple episodes of colitis int he last 2 years.  Normal lactic acid, afebrile on admission and mild leukocytosis.    Diabetes mellitus: hgba1c to be ordered, SSI.   Hypertension: Controlled.   Atrial fibrillation:  Rate controlled. Blood thinners on hold for rectal bleeding.    Hypothyroidism: Resume synthroid.  tsh ordered.   Shortness of breath and wheezing heard on exam: possibly from COPD.  Resume nebs as needed.      Code Status: FULL CODE DVT Prophylaxis:SCD'S Family Communication: none at bedside. Disposition Plan: admit to telemetry.   Time spent: [facility] Pager [phone] **Disclaimer: This note may have been dictated with voice recognition software. Similar sounding words can inadvertently be transcribed and this note may contain transcription errors which may not have been corrected upon publication of note.**

## 2014-06-24 NOTE — ED Notes (Signed)
This RN informed Miranda Guppy, MD that the pt would like more pain medication.

## 2014-06-25 ENCOUNTER — Inpatient Hospital Stay (HOSPITAL_COMMUNITY): Payer: Medicare Other

## 2014-06-25 DIAGNOSIS — J441 Chronic obstructive pulmonary disease with (acute) exacerbation: Secondary | ICD-10-CM

## 2014-06-25 LAB — HEMOGLOBIN AND HEMATOCRIT, BLOOD
HCT: 34.6 % — ABNORMAL LOW (ref 36.0–46.0)
HCT: 32.7 % — ABNORMAL LOW (ref 36.0–46.0)
HEMOGLOBIN: 10.9 g/dL — AB (ref 12.0–15.0)
Hemoglobin: 11.7 g/dL — ABNORMAL LOW (ref 12.0–15.0)

## 2014-06-25 LAB — CBC
HCT: 33 % — ABNORMAL LOW (ref 36.0–46.0)
Hemoglobin: 10.5 g/dL — ABNORMAL LOW (ref 12.0–15.0)
MCH: 29.7 pg (ref 26.0–34.0)
MCHC: 31.8 g/dL (ref 30.0–36.0)
MCV: 93.5 fL (ref 78.0–100.0)
PLATELETS: 185 10*3/uL (ref 150–400)
RBC: 3.53 MIL/uL — AB (ref 3.87–5.11)
RDW: 13.6 % (ref 11.5–15.5)
WBC: 6.3 10*3/uL (ref 4.0–10.5)

## 2014-06-25 LAB — URINALYSIS, ROUTINE W REFLEX MICROSCOPIC
Bilirubin Urine: NEGATIVE
GLUCOSE, UA: NEGATIVE mg/dL
HGB URINE DIPSTICK: NEGATIVE
KETONES UR: 15 mg/dL — AB
Nitrite: NEGATIVE
PH: 5 (ref 5.0–8.0)
PROTEIN: NEGATIVE mg/dL
Specific Gravity, Urine: 1.01 (ref 1.005–1.030)
Urobilinogen, UA: 0.2 mg/dL (ref 0.0–1.0)

## 2014-06-25 LAB — HEMOGLOBIN A1C
HEMOGLOBIN A1C: 7.2 % — AB (ref <5.7)
Mean Plasma Glucose: 160 mg/dL — ABNORMAL HIGH (ref <117)

## 2014-06-25 LAB — URINE MICROSCOPIC-ADD ON

## 2014-06-25 LAB — PROTIME-INR
INR: 1.21 (ref 0.00–1.49)
Prothrombin Time: 15.3 seconds — ABNORMAL HIGH (ref 11.6–15.2)

## 2014-06-25 LAB — GLUCOSE, CAPILLARY
GLUCOSE-CAPILLARY: 185 mg/dL — AB (ref 70–99)
Glucose-Capillary: 146 mg/dL — ABNORMAL HIGH (ref 70–99)
Glucose-Capillary: 158 mg/dL — ABNORMAL HIGH (ref 70–99)

## 2014-06-25 MED ORDER — IPRATROPIUM-ALBUTEROL 0.5-2.5 (3) MG/3ML IN SOLN
3.0000 mL | Freq: Three times a day (TID) | RESPIRATORY_TRACT | Status: DC
  Administered 2014-06-26 – 2014-06-27 (×5): 3 mL via RESPIRATORY_TRACT
  Filled 2014-06-25 (×5): qty 3

## 2014-06-25 MED ORDER — ALBUTEROL SULFATE (2.5 MG/3ML) 0.083% IN NEBU: 2.5000 mg | INHALATION_SOLUTION | RESPIRATORY_TRACT | Status: DC

## 2014-06-25 MED ORDER — ALBUTEROL SULFATE (2.5 MG/3ML) 0.083% IN NEBU
2.5000 mg | INHALATION_SOLUTION | RESPIRATORY_TRACT | Status: DC | PRN
  Administered 2014-06-28: 2.5 mg via RESPIRATORY_TRACT
  Filled 2014-06-25: qty 3

## 2014-06-25 MED ORDER — IPRATROPIUM-ALBUTEROL 0.5-2.5 (3) MG/3ML IN SOLN
3.0000 mL | RESPIRATORY_TRACT | Status: DC
  Administered 2014-06-25 (×3): 3 mL via RESPIRATORY_TRACT
  Filled 2014-06-25 (×3): qty 3

## 2014-06-25 MED ORDER — ALBUTEROL SULFATE (2.5 MG/3ML) 0.083% IN NEBU: 2.5000 mg | INHALATION_SOLUTION | Freq: Four times a day (QID) | RESPIRATORY_TRACT | Status: DC

## 2014-06-25 NOTE — Progress Notes (Signed)
[facility] PROGRESS NOTE  Miranda Erickson XBJ:[id number] DOB: 06-May-1935 DOA: 06/24/2014 PCP: Myriam Jacobson, MD  Assessment/Plan: Recurrent ischemic colitis/ infectious colitis:  -Continue with IV cipro and flagyl. Gentle hydration,  IV ptotonix. -CT abdomen 8-22: Severe wall thickening with surrounding inflammation is seen involving the splenic flexure and descending and sigmoid colon  consistent with colitis. -Gastroenterology consult to evaluate for colonoscopy.  -Multiple episodes of colitis int he last 2 years.  -Normal lactic acid, afebrile on admission and mild leukocytosis.  -I have advised patient to stop smoking.   Hematochezia: secondary to ischemic colitis. Follow Hb trend. Transfusion as needed. .   Diabetes mellitus:  hgba1c pending, SSI.  Hold 70/30 while NPO.   Hypertension:  Hold atenolol SBP soft in the 100.   Atrial fibrillation:  Rate controlled. Blood thinners on hold for rectal bleeding.   Hypothyroidism:  Continue with synthroid.  TSH at 1.1.   COPD.  Dyspnea, intermittently. Current smoker.  Check chest x ray.  Continue with nebulizer treatments, but change to Q 4 hours.     Code Status: Full Code.  Family Communication: Care discussed with daughter.  Disposition Plan: Remain inpatient.   Consultants:  Dr Oletta Lamas, GI  Procedures:  none  Antibiotics:  Ciprofloxacin 8-22  Flagyl 8-22  HPI/Subjective: Still with small leaking of blood per rectum. Pain better after she received pain medication. Breathing better at times.  She is current smoker.   Objective: Filed Vitals:   06/25/14 0309  BP: 100/42  Pulse: 82  Temp: 98.7 F (37.1 C)  Resp: 17    Intake/Output Summary (Last 24 hours) at 06/25/14 0845 Last data filed at 06/25/14 0622  Gross per 24 hour  Intake    760 ml  Output      0 ml  Net    760 ml   Filed Weights   06/24/14 0152 06/24/14 2011  Weight: 95.255 kg (210 lb) 95.255 kg (210 lb)     Exam:   General:  Alert in no distress.   Cardiovascular: S 1, S 2 RRR  Respiratory: Bilateral ronchus, wheezing, crackles.   Abdomen: BS present,s oft, generalized tenderness.   Musculoskeletal: SCD on   Data Reviewed: Basic Metabolic Panel:  Recent Labs Lab 06/24/14 0233  NA 137  K 4.2  CL 97  CO2 26  GLUCOSE 275*  BUN 19  CREATININE 1.00  CALCIUM 9.3   Liver Function Tests: No results found for this basename: AST, ALT, ALKPHOS, BILITOT, PROT, ALBUMIN,  in the last 168 hours No results found for this basename: LIPASE, AMYLASE,  in the last 168 hours No results found for this basename: AMMONIA,  in the last 168 hours CBC:  Recent Labs Lab 06/24/14 0233 06/24/14 0759 06/24/14 2048 06/25/14 0631  WBC 11.0* 9.9  --  6.3  HGB 13.3 12.7 12.1 10.5*  HCT 39.9 38.2 36.1 33.0*  MCV 92.1 91.8  --  93.5  PLT 210 211  --  185   Cardiac Enzymes: No results found for this basename: CKTOTAL, CKMB, CKMBINDEX, TROPONINI,  in the last 168 hours BNP (last 3 results) No results found for this basename: PROBNP,  in the last 8760 hours CBG:  Recent Labs Lab 06/24/14 0145 06/24/14 1203 06/24/14 1620 06/24/14 2013 06/25/14 0816  GLUCAP 262* 149* 161* 175* 185*    No results found for this or any previous visit (from the past 240 hour(s)).   Studies: Ct Abdomen Pelvis W Contrast  06/24/2014  CLINICAL DATA:  Abdominal pain, rectal bleeding.  EXAM: CT ABDOMEN AND PELVIS WITH CONTRAST  TECHNIQUE: Multidetector CT imaging of the abdomen and pelvis was performed using the standard protocol following bolus administration of intravenous contrast.  CONTRAST:  154mL OMNIPAQUE IOHEXOL 300 MG/ML  SOLN  COMPARISON:  CT scan of July 17, 2013.  FINDINGS: Visualized lung bases appear normal. No significant osseous abnormality is noted.  Status post cholecystectomy. The liver, spleen and pancreas appear normal. Adrenal glands and kidneys appear normal. 9 mm nonobstructive  calculus is noted in lower pole collecting system of right kidney. No hydronephrosis or renal obstruction is noted. Atherosclerotic calcifications of abdominal aorta are noted without aneurysm formation. Severe wall thickening with surrounding inflammation is seen involving the splenic flexure as well as the descending and sigmoid colon consistent with colitis. Patient reportedly status post appendectomy. No small bowel obstruction is noted. No abnormal fluid collection is noted. Urinary bladder appears normal. No significant adenopathy is noted. Uterus and ovaries appear normal.  IMPRESSION: 9 mm nonobstructive calculus seen in the right kidney.  Severe wall thickening with surrounding inflammation is seen involving the splenic flexure and descending and sigmoid colon consistent with colitis.   Electronically Signed   By: Sabino Dick M.D.   On: 06/24/2014 07:33    Scheduled Meds: . amitriptyline  100 mg Oral QHS  . ciprofloxacin  400 mg Intravenous Q12H  . docusate sodium  300-400 mg Oral Daily  . insulin aspart  0-9 Units Subcutaneous TID WC  . insulin aspart protamine- aspart  10 Units Subcutaneous BID WC  . ipratropium-albuterol  3 mL Nebulization Q6H  . levothyroxine  50 mcg Oral Daily  . metronidazole  500 mg Intravenous 3 times per day  . pantoprazole (PROTONIX) IV  40 mg Intravenous Q24H  . PARoxetine  10 mg Oral Daily   Continuous Infusions: . sodium chloride 75 mL/hr at 06/24/14 7915    Active Problems:   Diabetes mellitus type 2, controlled, with complications   COPD (chronic obstructive pulmonary disease)   Hypertension   Hypothyroidism   Prior ischemic Colitis   Rectal bleeding   CKD (chronic kidney disease) stage 2, GFR 60-89 ml/min   Atrial fibrillation    Time spent: 35 minutes.     Niel Hummer A  [facility] Pager [phone]. If 7PM-7AM, please contact night-coverage at [email or web address], password [facility] 06/25/2014, 8:45 AM  LOS: 1 day

## 2014-06-25 NOTE — Progress Notes (Signed)
[facility] PROGRESS NOTE Subjective Patient is feeling about the same. Still having loose stools and some rectal bleeding.  Objective: Vital signs in last 24 hours: Temp:  [98.5 F (36.9 C)-98.8 F (37.1 C)] 98.7 F (37.1 C) (08/23 0309) Pulse Rate:  [71-97] 82 (08/23 0309) Resp:  [17-18] 17 (08/23 0309) BP: (100-120)/(42-79) 100/42 mmHg (08/23 0309) SpO2:  [90 %-95 %] 90 % (08/23 0309) Weight:  [95.255 kg (210 lb)] 95.255 kg (210 lb) (08/22 2011) Last BM Date: 06/24/14  Intake/Output from previous day: 08/22 0701 - 08/23 0700 In: 760 [P.O.:60; IV Piggyback:700] Out: -  Intake/Output this shift:    PE: General--alert no acute distress  Abdomen--soft nondistended with good bowel sounds. Mild left upper quadrant and left lower quadrant tenderness unchanged.  Lab Results:  Recent Labs  06/24/14 0233 06/24/14 0759 06/24/14 2048 06/25/14 0631  WBC 11.0* 9.9  --  6.3  HGB 13.3 12.7 12.1 10.5*  HCT 39.9 38.2 36.1 33.0*  PLT 210 211  --  185   BMET  Recent Labs  06/24/14 0233  NA 137  K 4.2  CL 97  CO2 26  CREATININE 1.00   LFT No results found for this basename: PROT, AST, ALT, ALKPHOS, BILITOT, BILIDIR, IBILI,  in the last 72 hours PT/INR No results found for this basename: LABPROT, INR,  in the last 72 hours PANCREAS No results found for this basename: LIPASE,  in the last 72 hours       Studies/Results: Ct Abdomen Pelvis W Contrast  06/24/2014   CLINICAL DATA:  Abdominal pain, rectal bleeding.  EXAM: CT ABDOMEN AND PELVIS WITH CONTRAST  TECHNIQUE: Multidetector CT imaging of the abdomen and pelvis was performed using the standard protocol following bolus administration of intravenous contrast.  CONTRAST:  159mL OMNIPAQUE IOHEXOL 300 MG/ML  SOLN  COMPARISON:  CT scan of July 17, 2013.  FINDINGS: Visualized lung bases appear normal. No significant osseous abnormality is noted.  Status post cholecystectomy. The liver, spleen and pancreas  appear normal. Adrenal glands and kidneys appear normal. 9 mm nonobstructive calculus is noted in lower pole collecting system of right kidney. No hydronephrosis or renal obstruction is noted. Atherosclerotic calcifications of abdominal aorta are noted without aneurysm formation. Severe wall thickening with surrounding inflammation is seen involving the splenic flexure as well as the descending and sigmoid colon consistent with colitis. Patient reportedly status post appendectomy. No small bowel obstruction is noted. No abnormal fluid collection is noted. Urinary bladder appears normal. No significant adenopathy is noted. Uterus and ovaries appear normal.  IMPRESSION: 9 mm nonobstructive calculus seen in the right kidney.  Severe wall thickening with surrounding inflammation is seen involving the splenic flexure and descending and sigmoid colon consistent with colitis.   Electronically Signed   By: Sabino Dick M.D.   On: 06/24/2014 07:33    Medications: I have reviewed the patient's current medications.  Assessment/Plan: 1. Acute colitis/bleeding/abdominal pain. All of her symptoms are most consistent with ischemic colitis. She is not worse but not a great deal better yet either. I think she should have colonoscopy this admission to document the etiology of the colitis. We will reexamine tomorrow and see if she is in any condition to consider a colonoscopy prep.   Porcia Morganti JR,Haitham Dolinsky L 06/25/2014, 9:43 AM

## 2014-06-26 LAB — BASIC METABOLIC PANEL
ANION GAP: 13 (ref 5–15)
BUN: 10 mg/dL (ref 6–23)
CALCIUM: 8 mg/dL — AB (ref 8.4–10.5)
CO2: 24 meq/L (ref 19–32)
Chloride: 101 mEq/L (ref 96–112)
Creatinine, Ser: 0.87 mg/dL (ref 0.50–1.10)
GFR calc Af Amer: 73 mL/min — ABNORMAL LOW (ref >90)
GFR calc non Af Amer: 63 mL/min — ABNORMAL LOW (ref >90)
Glucose, Bld: 158 mg/dL — ABNORMAL HIGH (ref 70–99)
POTASSIUM: 3.7 meq/L (ref 3.7–5.3)
SODIUM: 138 meq/L (ref 137–147)

## 2014-06-26 LAB — GLUCOSE, CAPILLARY
GLUCOSE-CAPILLARY: 172 mg/dL — AB (ref 70–99)
GLUCOSE-CAPILLARY: 138 mg/dL — AB (ref 70–99)
Glucose-Capillary: 182 mg/dL — ABNORMAL HIGH (ref 70–99)

## 2014-06-26 LAB — HEMOGLOBIN AND HEMATOCRIT, BLOOD
HCT: 36 % (ref 36.0–46.0)
HEMATOCRIT: 32.4 % — AB (ref 36.0–46.0)
HEMATOCRIT: 35.8 % — AB (ref 36.0–46.0)
HEMOGLOBIN: 11.9 g/dL — AB (ref 12.0–15.0)
Hemoglobin: 10.9 g/dL — ABNORMAL LOW (ref 12.0–15.0)
Hemoglobin: 11.8 g/dL — ABNORMAL LOW (ref 12.0–15.0)

## 2014-06-26 MED ORDER — ATENOLOL 100 MG PO TABS
100.0000 mg | ORAL_TABLET | Freq: Every morning | ORAL | Status: DC
  Administered 2014-06-26 – 2014-06-29 (×4): 100 mg via ORAL
  Filled 2014-06-26 (×4): qty 1

## 2014-06-26 MED ORDER — ALPRAZOLAM 0.5 MG PO TABS
0.5000 mg | ORAL_TABLET | Freq: Two times a day (BID) | ORAL | Status: DC | PRN
  Administered 2014-06-27 – 2014-06-28 (×3): 0.5 mg via ORAL
  Filled 2014-06-26 (×3): qty 1

## 2014-06-26 NOTE — Progress Notes (Signed)
Nutrition Brief Note  Patient identified on the Malnutrition Screening Tool (MST) Report  Wt Readings from Last 15 Encounters:  06/25/14 214 lb 4.6 oz (97.2 kg)  07/18/13 198 lb 3.1 oz (89.9 kg)  01/07/13 188 lb (85.276 kg)  11/16/11 203 lb 7.8 oz (92.3 kg)  11/16/11 203 lb 7.8 oz (92.3 kg)    Body mass index is 39.18 kg/(m^2). Patient meets criteria for class II obesity based on current BMI.   Current diet order is NPO. Pt with some loose stools and some rectal bleeding. Labs and medications reviewed. Pt reports prior to admission, she has been having a great appetite and eating well with no recent significant weight loss.  No nutrition interventions warranted at this time. If nutrition issues arise, please consult RD.   Kallie Locks, MS, Provisional LDN Pager # [phone] After hours/ weekend pager # [phone]

## 2014-06-26 NOTE — Progress Notes (Signed)
[facility] PROGRESS NOTE Subjective patient still with pain, diarrhea in some rectal bleeding. No fever or chills.  Objective: Vital signs in last 24 hours: Temp:  [98.1 F (36.7 C)-98.6 F (37 C)] 98.1 F (36.7 C) (08/24 1017) Pulse Rate:  [63-101] 101 (08/24 1017) Resp:  [18-20] 19 (08/24 1017) BP: (112-148)/(51-65) 139/51 mmHg (08/24 1017) SpO2:  [88 %-97 %] 94 % (08/24 1017) Weight:  [97.2 kg (214 lb 4.6 oz)] 97.2 kg (214 lb 4.6 oz) (08/23 2101) Last BM Date: 06/25/14  Intake/Output from previous day: 08/23 0701 - 08/24 0700 In: 3735.9 [P.O.:60; I.V.:2975.9; IV Piggyback:700] Out: -  Intake/Output this shift: Total I/O In: -  Out: 200 [Urine:200]  PE: General-- no acute distress able to get up to bedside toilet to urinate Heart-- Lungs-- Abdomen-- slightly distended mild left-sided tenderness bowel sounds are present  Lab Results:  Recent Labs  06/24/14 0233 06/24/14 0759  06/25/14 0631 06/25/14 1021 06/25/14 1910 06/26/14 0100 06/26/14 0940  WBC 11.0* 9.9  --  6.3  --   --   --   --   HGB 13.3 12.7  < > 10.5* 11.7* 10.9* 10.9* 11.8*  HCT 39.9 38.2  < > 33.0* 34.6* 32.7* 32.4* 35.8*  PLT 210 211  --  185  --   --   --   --   < > = values in this interval not displayed. BMET  Recent Labs  06/24/14 0233 06/26/14 0100  NA 137 138  K 4.2 3.7  CL 97 101  CO2 26 24  CREATININE 1.00 0.87   LFT No results found for this basename: PROT, AST, ALT, ALKPHOS, BILITOT, BILIDIR, IBILI,  in the last 72 hours PT/INR  Recent Labs  06/25/14 1021  LABPROT 15.3*  INR 1.21   PANCREAS No results found for this basename: LIPASE,  in the last 72 hours       Studies/Results: Dg Chest 2 View  06/25/2014   CLINICAL DATA:  Shortness of breath and wheezing  EXAM: CHEST  2 VIEW  COMPARISON:  PA and lateral chest of November 09, 2007  FINDINGS: The lungs are mildly hyperinflated. The interstitial markings are coarse especially inferiorly but this is not  entirely new. There is no pleural effusion. The cardiac silhouette is enlarged. The central pulmonary vascularity is prominent. The bony thorax exhibits no acute abnormality.  IMPRESSION: The findings suggest low-grade CHF superimposed upon COPD. There is no pneumonia.   Electronically Signed   By: David  Martinique   On: 06/25/2014 11:10    Medications: I have reviewed the patient's current medications.  Assessment/Plan: 1. Abdominal pain/diarrhea/rectal bleeding. CT shows segment will colitis and this is most consistent with ischemic colitis. Patient is on Cipro on Flagyl as well as protonix. I think a flexible sigmoid just to obtain tissue diagnosis would be reasonable. Will plan on scheduling this 11 AM tomorrow after tapwater enemas. We will go ahead and continue on the antibiotic for now.   Dana Dorner JR,Daxson Reffett L 06/26/2014, 1:46 PM

## 2014-06-26 NOTE — Progress Notes (Signed)
Note and chart reviewed. No nutrition needs at this time. Consult RD if needed. Pryor Ochoa RD, LDN Inpatient Clinical Dietitian Pager: [phone] After Hours Pager: [phone]

## 2014-06-26 NOTE — Progress Notes (Signed)
[facility] PROGRESS NOTE  Miranda Erickson FAO:[id number] DOB: 11-26-1934 DOA: 06/24/2014 PCP: Myriam Jacobson, MD  Assessment/Plan: Recurrent ischemic colitis/ infectious colitis:  -Continue with IV cipro and flagyl.  IV ptotonix. -CT abdomen 8-22: Severe wall thickening with surrounding inflammation is seen involving the splenic flexure and descending and sigmoid colon  consistent with colitis. -Gastroenterology consult to evaluate for colonoscopy.  -Multiple episodes of colitis int he last 2 years.  -Normal lactic acid, afebrile on admission and mild leukocytosis.  -I have advised patient to stop smoking.  -NSL fluids Chest x ray with mild CHF pattern.   Hematochezia: secondary to ischemic colitis. Follow Hb trend. Transfusion as needed. . HB stable at 11.   Diabetes mellitus:  hgba1c pending, SSI.  Hold 70/30 while NPO.   Hypertension:  Resume atenolol.   Atrial fibrillation:  Rate controlled. Blood thinners on hold for rectal bleeding.  Resume atenolol.   Hypothyroidism:  Continue with synthroid.  TSH at 1.1.   Hypoxic respiratory Failure: COPD and component of pulmonary edema, acute diastolic dyfunction. NSL fluids.  Dyspnea, intermittently. Current smoker.  Check chest x ray mild CHF pattern.  Continue with nebulizer treatments, but change to Q 4 hours.     Code Status: Full Code.  Family Communication: Care discussed with patient.  Disposition Plan: Remain inpatient.   Consultants:  Dr Oletta Lamas, GI  Procedures:  none  Antibiotics:  Ciprofloxacin 8-22  Flagyl 8-22  HPI/Subjective: She had an anxiety episode this morning, unable to breath. She is feeling better.  Had BM small amount of blood early this morning.   Objective: Filed Vitals:   06/26/14 1017  BP: 139/51  Pulse: 101  Temp: 98.1 F (36.7 C)  Resp: 19    Intake/Output Summary (Last 24 hours) at 06/26/14 1114 Last data filed at 06/26/14 0746  Gross per 24 hour  Intake  3735.92 ml  Output    200 ml  Net 3535.92 ml   Filed Weights   06/24/14 0152 06/24/14 2011 06/25/14 2101  Weight: 95.255 kg (210 lb) 95.255 kg (210 lb) 97.2 kg (214 lb 4.6 oz)    Exam:   General:  Alert in no distress.   Cardiovascular: S 1, S 2 RRR  Respiratory: Bilateral ronchus, wheezing, crackles.   Abdomen: BS present,s oft, generalized tenderness.   Musculoskeletal: SCD on   Data Reviewed: Basic Metabolic Panel:  Recent Labs Lab 06/24/14 0233 06/26/14 0100  NA 137 138  K 4.2 3.7  CL 97 101  CO2 26 24  GLUCOSE 275* 158*  BUN 19 10  CREATININE 1.00 0.87  CALCIUM 9.3 8.0*   Liver Function Tests: No results found for this basename: AST, ALT, ALKPHOS, BILITOT, PROT, ALBUMIN,  in the last 168 hours No results found for this basename: LIPASE, AMYLASE,  in the last 168 hours No results found for this basename: AMMONIA,  in the last 168 hours CBC:  Recent Labs Lab 06/24/14 0233 06/24/14 0759  06/25/14 0631 06/25/14 1021 06/25/14 1910 06/26/14 0100 06/26/14 0940  WBC 11.0* 9.9  --  6.3  --   --   --   --   HGB 13.3 12.7  < > 10.5* 11.7* 10.9* 10.9* 11.8*  HCT 39.9 38.2  < > 33.0* 34.6* 32.7* 32.4* 35.8*  MCV 92.1 91.8  --  93.5  --   --   --   --   PLT 210 211  --  185  --   --   --   --   < > =  values in this interval not displayed. Cardiac Enzymes: No results found for this basename: CKTOTAL, CKMB, CKMBINDEX, TROPONINI,  in the last 168 hours BNP (last 3 results) No results found for this basename: PROBNP,  in the last 8760 hours CBG:  Recent Labs Lab 06/24/14 2013 06/25/14 0816 06/25/14 1755 06/25/14 2100 06/26/14 0738  GLUCAP 175* 185* 146* 158* 182*    No results found for this or any previous visit (from the past 240 hour(s)).   Studies: Dg Chest 2 View  06/25/2014   CLINICAL DATA:  Shortness of breath and wheezing  EXAM: CHEST  2 VIEW  COMPARISON:  PA and lateral chest of November 09, 2007  FINDINGS: The lungs are mildly hyperinflated.  The interstitial markings are coarse especially inferiorly but this is not entirely new. There is no pleural effusion. The cardiac silhouette is enlarged. The central pulmonary vascularity is prominent. The bony thorax exhibits no acute abnormality.  IMPRESSION: The findings suggest low-grade CHF superimposed upon COPD. There is no pneumonia.   Electronically Signed   By: David  Martinique   On: 06/25/2014 11:10    Scheduled Meds: . amitriptyline  100 mg Oral QHS  . atenolol  100 mg Oral q morning - 10a  . ciprofloxacin  400 mg Intravenous Q12H  . insulin aspart  0-9 Units Subcutaneous TID WC  . ipratropium-albuterol  3 mL Nebulization TID  . levothyroxine  50 mcg Oral Daily  . metronidazole  500 mg Intravenous 3 times per day  . pantoprazole (PROTONIX) IV  40 mg Intravenous Q24H  . PARoxetine  10 mg Oral Daily   Continuous Infusions: . sodium chloride 40 mL/hr at 06/26/14 1105    Active Problems:   Diabetes mellitus type 2, controlled, with complications   COPD (chronic obstructive pulmonary disease)   Hypertension   Hypothyroidism   Prior ischemic Colitis   Rectal bleeding   CKD (chronic kidney disease) stage 2, GFR 60-89 ml/min   Atrial fibrillation    Time spent: 35 minutes.     Niel Hummer A  [facility] Pager [phone]. If 7PM-7AM, please contact night-coverage at [email or web address], password [facility] 06/26/2014, 11:14 AM  LOS: 2 days

## 2014-06-27 ENCOUNTER — Encounter (HOSPITAL_COMMUNITY): Payer: Self-pay

## 2014-06-27 ENCOUNTER — Encounter (HOSPITAL_COMMUNITY)
Admission: EM | Disposition: A | Discharge status: Home / Self Care | Payer: Medicare Other | Source: Home / Self Care | Attending: Internal Medicine

## 2014-06-27 HISTORY — PX: FLEXIBLE SIGMOIDOSCOPY: SHX5431

## 2014-06-27 LAB — CBC
HCT: 35.4 % — ABNORMAL LOW (ref 36.0–46.0)
HEMOGLOBIN: 11.5 g/dL — AB (ref 12.0–15.0)
MCH: 30.8 pg (ref 26.0–34.0)
MCHC: 32.5 g/dL (ref 30.0–36.0)
MCV: 94.9 fL (ref 78.0–100.0)
Platelets: 214 10*3/uL (ref 150–400)
RBC: 3.73 MIL/uL — ABNORMAL LOW (ref 3.87–5.11)
RDW: 13.6 % (ref 11.5–15.5)
WBC: 7.7 10*3/uL (ref 4.0–10.5)

## 2014-06-27 LAB — BASIC METABOLIC PANEL
ANION GAP: 16 — AB (ref 5–15)
BUN: 8 mg/dL (ref 6–23)
CALCIUM: 8.3 mg/dL — AB (ref 8.4–10.5)
CO2: 22 mEq/L (ref 19–32)
Chloride: 101 mEq/L (ref 96–112)
Creatinine, Ser: 0.77 mg/dL (ref 0.50–1.10)
GFR calc Af Amer: >90 mL/min (ref >90)
GFR calc non Af Amer: 79 mL/min — ABNORMAL LOW (ref >90)
Glucose, Bld: 147 mg/dL — ABNORMAL HIGH (ref 70–99)
Potassium: 4 mEq/L (ref 3.7–5.3)
SODIUM: 139 meq/L (ref 137–147)

## 2014-06-27 LAB — GLUCOSE, CAPILLARY
GLUCOSE-CAPILLARY: 148 mg/dL — AB (ref 70–99)
Glucose-Capillary: 228 mg/dL — ABNORMAL HIGH (ref 70–99)
Glucose-Capillary: 153 mg/dL — ABNORMAL HIGH (ref 70–99)

## 2014-06-27 SURGERY — SIGMOIDOSCOPY, FLEXIBLE
Anesthesia: Moderate Sedation

## 2014-06-27 MED ORDER — FENTANYL CITRATE 0.05 MG/ML IJ SOLN
INTRAMUSCULAR | Status: DC | PRN
  Administered 2014-06-27: 25 ug via INTRAVENOUS
  Administered 2014-06-27: 12.5 ug via INTRAVENOUS

## 2014-06-27 MED ORDER — MIDAZOLAM HCL 10 MG/2ML IJ SOLN
INTRAMUSCULAR | Status: DC | PRN
  Administered 2014-06-27: 2 mg via INTRAVENOUS

## 2014-06-27 MED ORDER — FENTANYL CITRATE 0.05 MG/ML IJ SOLN
INTRAMUSCULAR | Status: AC
  Filled 2014-06-27: qty 2

## 2014-06-27 MED ORDER — POLYETHYLENE GLYCOL 3350 17 G PO PACK
17.0000 g | PACK | Freq: Every day | ORAL | Status: DC
  Administered 2014-06-28: 17 g via ORAL
  Filled 2014-06-27 (×3): qty 1

## 2014-06-27 MED ORDER — SODIUM CHLORIDE 0.9 % IV SOLN: INTRAVENOUS | Status: DC

## 2014-06-27 MED ORDER — SODIUM CHLORIDE 0.9 % IV SOLN
INTRAVENOUS | Status: DC
Start: 2014-06-27 — End: 2014-06-27

## 2014-06-27 MED ORDER — MIDAZOLAM HCL 5 MG/ML IJ SOLN
INTRAMUSCULAR | Status: AC
  Filled 2014-06-27: qty 1

## 2014-06-27 NOTE — Progress Notes (Signed)
Family member and patient insisted on having all four side rails up on bed to decrease anxiety regarding patient's panic attacks.  RN explained policy regarding side rails; family member stated that she will put down side rail soon.  Will continue to monitor patient.

## 2014-06-27 NOTE — Op Note (Signed)
[facility] [place], [address]   FLEXIBLE SIGMOIDOSCOPY PROCEDURE REPORT  PATIENT: Miranda Erickson, Miranda Erickson  MR#: [id number] BIRTHDATE: [birth year] , 16  yrs. old GENDER: Female ENDOSCOPIST: Laurence Spates, MD REFERRED BY:  [facility]. PCP: Dr. Janeice Robinson PROCEDURE DATE:  06/27/2014 PROCEDURE:  Sigmoidoscopy with Biopsy ASA CLASS:    class III INDICATIONS: patient with history of ischemic colitis came in with acute episode of abdominal pain and rectal bleeding with thickening of the left colon on CT scan. She is still having diarrhea, pain, and bleeding. This is done to evaluate mucosal particularly for signs of ischemia or necrosis. The patient has been prepped with tap water enemas MEDICATIONS: fentanyl 37.5 mcg, versed 2 mg IV  DESCRIPTION OF PROCEDURE:   ThePentax pediatric colonoscope was inserted following a digital exam. Prep was quite good. We are able to advance up to the descending: in approximately 60 cm. The mucosa at this level as well as in the distance was completely endoscopically normal. The scope was withdrawn in approximately 40 cm from the anal verge we began to see submucosal hemorrhages. There were a few small linear ulcerations. This picture persistent from 40 cm down to the proximal rectum. Multiple biopsies were obtained. The patient tolerated procedure well there were no immediate complications.        COMPLICATIONS:  None  ENDOSCOPIC IMPRESSION: 1. Submucosal Hemorrhages. These are located in the sigmoid colon and her most consistent with ischemia. They're minimal ulcerations at this time. I suspect this will be all due to ischemic colitis.  RECOMMENDATIONS: 1. would continue current therapy and will start patient on small amount Clear liquid. 2. We'll begin hurl Miralax to make sure that she does not come constipated.   _______________________________ eSigned:  Laurence Spates, MD 06/27/2014 11:35  AM CC: Dr. Janeice Robinson

## 2014-06-27 NOTE — Interval H&P Note (Signed)
History and Physical Interval Note:  06/27/2014 10:54 AM  Miranda Erickson  has presented today for surgery, with the diagnosis of rectal bleeding  The various methods of treatment have been discussed with the patient and family. After consideration of risks, benefits and other options for treatment, the patient has consented to  Procedure(s): FLEXIBLE SIGMOIDOSCOPY (N/A) as a surgical intervention .  The patient's history has been reviewed, patient examined, no change in status, stable for surgery.  I have reviewed the patient's chart and labs.  Questions were answered to the patient's satisfaction.     [name],Taylorann Tkach L

## 2014-06-27 NOTE — H&P (View-Only) (Signed)
[facility] PROGRESS NOTE Subjective patient still with pain, diarrhea in some rectal bleeding. No fever or chills.  Objective: Vital signs in last 24 hours: Temp:  [98.1 F (36.7 C)-98.6 F (37 C)] 98.1 F (36.7 C) (08/24 1017) Pulse Rate:  [63-101] 101 (08/24 1017) Resp:  [18-20] 19 (08/24 1017) BP: (112-148)/(51-65) 139/51 mmHg (08/24 1017) SpO2:  [88 %-97 %] 94 % (08/24 1017) Weight:  [97.2 kg (214 lb 4.6 oz)] 97.2 kg (214 lb 4.6 oz) (08/23 2101) Last BM Date: 06/25/14  Intake/Output from previous day: 08/23 0701 - 08/24 0700 In: 3735.9 [P.O.:60; I.V.:2975.9; IV Piggyback:700] Out: -  Intake/Output this shift: Total I/O In: -  Out: 200 [Urine:200]  PE: General-- no acute distress able to get up to bedside toilet to urinate Heart-- Lungs-- Abdomen-- slightly distended mild left-sided tenderness bowel sounds are present  Lab Results:  Recent Labs  06/24/14 0233 06/24/14 0759  06/25/14 0631 06/25/14 1021 06/25/14 1910 06/26/14 0100 06/26/14 0940  WBC 11.0* 9.9  --  6.3  --   --   --   --   HGB 13.3 12.7  < > 10.5* 11.7* 10.9* 10.9* 11.8*  HCT 39.9 38.2  < > 33.0* 34.6* 32.7* 32.4* 35.8*  PLT 210 211  --  185  --   --   --   --   < > = values in this interval not displayed. BMET  Recent Labs  06/24/14 0233 06/26/14 0100  NA 137 138  K 4.2 3.7  CL 97 101  CO2 26 24  CREATININE 1.00 0.87   LFT No results found for this basename: PROT, AST, ALT, ALKPHOS, BILITOT, BILIDIR, IBILI,  in the last 72 hours PT/INR  Recent Labs  06/25/14 1021  LABPROT 15.3*  INR 1.21   PANCREAS No results found for this basename: LIPASE,  in the last 72 hours       Studies/Results: Dg Chest 2 View  06/25/2014   CLINICAL DATA:  Shortness of breath and wheezing  EXAM: CHEST  2 VIEW  COMPARISON:  PA and lateral chest of November 09, 2007  FINDINGS: The lungs are mildly hyperinflated. The interstitial markings are coarse especially inferiorly but this is not  entirely new. There is no pleural effusion. The cardiac silhouette is enlarged. The central pulmonary vascularity is prominent. The bony thorax exhibits no acute abnormality.  IMPRESSION: The findings suggest low-grade CHF superimposed upon COPD. There is no pneumonia.   Electronically Signed   By: David  Martin[place]   On: 06/25/2014 11:10    Medications: I have reviewed the patient's current medications.  Assessment/Plan: 1. Abdominal pain/diarrhea/rectal bleeding. CT shows segment will colitis and this is most consistent with ischemic colitis. Patient is on Cipro on Flagyl as well as protonix. I think a flexible sigmoid just to obtain tissue diagnosis would be reasonable. Will plan on scheduling this 11 AM tomorrow after tapwater enemas. We will go ahead and continue on the antibiotic for now.   Braylyn Eye JR,Saw Mendenhall L 06/26/2014, 1:46 PM

## 2014-06-27 NOTE — Progress Notes (Signed)
Family member refusing patient's bed alarm this evening shift.  Re-educated patient on importance of utilizing bed alarm; patient still refused.  Non-skid socks on.  Bed in lowest position.  Emphasized use of call bell if needed to use the bathroom; verbalized understanding.  Family member stated that she will be in the room all night with patient, and will notify RN when she leaves.  Will continue to monitor patient.

## 2014-06-27 NOTE — Progress Notes (Signed)
[facility] PROGRESS NOTE  Miranda Erickson ION:[id number] DOB: May 04, 1935 DOA: 06/24/2014 PCP: Myriam Jacobson, MD  Assessment/Plan: 79 year old with PMH significant for hypertension, diabetes mellitus, ischemic colitis in the past, atrial fibrillation not on coumadin or other anticoagulants, diastolic grade 1, admitted with abdominal pain, found to have recurrent ischemic colitis. Plan for sigmoid endoscopy  today.   Recurrent ischemic colitis/ infectious colitis:  -Continue with IV cipro and flagyl day 4 .  IV ptotonix. -CT abdomen 8-22: Severe wall thickening with surrounding inflammation is seen involving the splenic flexure and descending and sigmoid colon  consistent with colitis. -Gastroenterology following.  -Multiple episodes of colitis int he last 2 years.  -repeat lactic acid, patient with different abdominal pain.  -Plan for sigmoid endoscopy today.  -I have advised patient to stop smoking.  -resume IVF fluids.   Hematochezia: secondary to ischemic colitis. Follow Hb trend. Transfusion as needed. . HB stable at 11.   Diabetes mellitus:  hgba1c pending, SSI.  Hold 70/30 while NPO.   Hypertension:  Continue with atenolol.   Atrial fibrillation:  Rate controlled. Blood thinners on hold for rectal bleeding.  Continue with  atenolol.   Hypothyroidism:  Continue with synthroid.  TSH at 1.1.   Hypoxic respiratory Failure: COPD and component of pulmonary edema, acute diastolic dyfunction.  Dyspnea,improved. Current smoker.  chest x ray mild CHF pattern.  Continue with nebulizer treatments, but change to Q 4 hours.  Will monitor on IV fluids, will stop fluids if patient is started on diet.     Code Status: Full Code.  Family Communication: Care discussed with patient.  Disposition Plan: Remain inpatient.   Consultants:  Dr Oletta Lamas, GI  Procedures:  none  Antibiotics:  Ciprofloxacin 8-22  Flagyl 8-22  HPI/Subjective: No more BM, complaining  of different type of abdominal pain.  No more blood in the stool.  Objective: Filed Vitals:   06/27/14 0944  BP: 129/75  Pulse: 86  Temp: 97.9 F (36.6 C)  Resp: 19    Intake/Output Summary (Last 24 hours) at 06/27/14 0953 Last data filed at 06/27/14 0900  Gross per 24 hour  Intake      0 ml  Output    100 ml  Net   -100 ml   Filed Weights   06/24/14 2011 06/25/14 2101 06/26/14 2126  Weight: 95.255 kg (210 lb) 97.2 kg (214 lb 4.6 oz) 97.2 kg (214 lb 4.6 oz)    Exam:   General:  Alert in no distress.   Cardiovascular: S 1, S 2 RRR  Respiratory: Bilateral ronchus, wheezing, crackles.   Abdomen: BS present,s oft, generalized tenderness. Less distended.   Musculoskeletal: SCD on   Data Reviewed: Basic Metabolic Panel:  Recent Labs Lab 06/24/14 0233 06/26/14 0100 06/27/14 0644  NA 137 138 139  K 4.2 3.7 4.0  CL 97 101 101  CO2 26 24 22   GLUCOSE 275* 158* 147*  BUN 19 10 8   CREATININE 1.00 0.87 0.77  CALCIUM 9.3 8.0* 8.3*   Liver Function Tests: No results found for this basename: AST, ALT, ALKPHOS, BILITOT, PROT, ALBUMIN,  in the last 168 hours No results found for this basename: LIPASE, AMYLASE,  in the last 168 hours No results found for this basename: AMMONIA,  in the last 168 hours CBC:  Recent Labs Lab 06/24/14 0233 06/24/14 0759  06/25/14 0631  06/25/14 1910 06/26/14 0100 06/26/14 0940 06/26/14 1856 06/27/14 0644  WBC 11.0* 9.9  --  6.3  --   --   --   --   --  7.7  HGB 13.3 12.7  < > 10.5*  < > 10.9* 10.9* 11.8* 11.9* 11.5*  HCT 39.9 38.2  < > 33.0*  < > 32.7* 32.4* 35.8* 36.0 35.4*  MCV 92.1 91.8  --  93.5  --   --   --   --   --  94.9  PLT 210 211  --  185  --   --   --   --   --  214  < > = values in this interval not displayed. Cardiac Enzymes: No results found for this basename: CKTOTAL, CKMB, CKMBINDEX, TROPONINI,  in the last 168 hours BNP (last 3 results) No results found for this basename: PROBNP,  in the last 8760  hours CBG:  Recent Labs Lab 06/25/14 2100 06/26/14 0738 06/26/14 1143 06/26/14 1653 06/27/14 0800  GLUCAP 158* 182* 172* 138* 148*    No results found for this or any previous visit (from the past 240 hour(s)).   Studies: Dg Chest 2 View  06/25/2014   CLINICAL DATA:  Shortness of breath and wheezing  EXAM: CHEST  2 VIEW  COMPARISON:  PA and lateral chest of November 09, 2007  FINDINGS: The lungs are mildly hyperinflated. The interstitial markings are coarse especially inferiorly but this is not entirely new. There is no pleural effusion. The cardiac silhouette is enlarged. The central pulmonary vascularity is prominent. The bony thorax exhibits no acute abnormality.  IMPRESSION: The findings suggest low-grade CHF superimposed upon COPD. There is no pneumonia.   Electronically Signed   By: David  Martinique   On: 06/25/2014 11:10    Scheduled Meds: . amitriptyline  100 mg Oral QHS  . atenolol  100 mg Oral q morning - 10a  . ciprofloxacin  400 mg Intravenous Q12H  . insulin aspart  0-9 Units Subcutaneous TID WC  . ipratropium-albuterol  3 mL Nebulization TID  . levothyroxine  50 mcg Oral Daily  . metronidazole  500 mg Intravenous 3 times per day  . pantoprazole (PROTONIX) IV  40 mg Intravenous Q24H  . PARoxetine  10 mg Oral Daily   Continuous Infusions: . sodium chloride      Active Problems:   Diabetes mellitus type 2, controlled, with complications   COPD (chronic obstructive pulmonary disease)   Hypertension   Hypothyroidism   Prior ischemic Colitis   Rectal bleeding   CKD (chronic kidney disease) stage 2, GFR 60-89 ml/min   Atrial fibrillation    Time spent: 35 minutes.     Niel Hummer A  [facility] Pager [phone]. If 7PM-7AM, please contact night-coverage at [email or web address], password [facility] 06/27/2014, 9:53 AM  LOS: 3 days

## 2014-06-28 ENCOUNTER — Inpatient Hospital Stay (HOSPITAL_COMMUNITY): Payer: Medicare Other

## 2014-06-28 ENCOUNTER — Encounter (HOSPITAL_COMMUNITY): Payer: Self-pay | Admitting: Gastroenterology

## 2014-06-28 DIAGNOSIS — E039 Hypothyroidism, unspecified: Secondary | ICD-10-CM

## 2014-06-28 LAB — BASIC METABOLIC PANEL
Anion gap: 13 (ref 5–15)
BUN: 10 mg/dL (ref 6–23)
CALCIUM: 8.5 mg/dL (ref 8.4–10.5)
CO2: 26 meq/L (ref 19–32)
Chloride: 99 mEq/L (ref 96–112)
Creatinine, Ser: 0.73 mg/dL (ref 0.50–1.10)
GFR calc Af Amer: >90 mL/min (ref >90)
GFR calc non Af Amer: 80 mL/min — ABNORMAL LOW (ref >90)
GLUCOSE: 191 mg/dL — AB (ref 70–99)
Potassium: 4.2 mEq/L (ref 3.7–5.3)
Sodium: 138 mEq/L (ref 137–147)

## 2014-06-28 LAB — CBC
HCT: 37 % (ref 36.0–46.0)
HEMOGLOBIN: 12.3 g/dL (ref 12.0–15.0)
MCH: 30.7 pg (ref 26.0–34.0)
MCHC: 33.2 g/dL (ref 30.0–36.0)
MCV: 92.3 fL (ref 78.0–100.0)
PLATELETS: 226 10*3/uL (ref 150–400)
RBC: 4.01 MIL/uL (ref 3.87–5.11)
RDW: 13.5 % (ref 11.5–15.5)
WBC: 9.1 10*3/uL (ref 4.0–10.5)

## 2014-06-28 LAB — GLUCOSE, CAPILLARY
GLUCOSE-CAPILLARY: 202 mg/dL — AB (ref 70–99)
GLUCOSE-CAPILLARY: 260 mg/dL — AB (ref 70–99)
GLUCOSE-CAPILLARY: 224 mg/dL — AB (ref 70–99)
Glucose-Capillary: 314 mg/dL — ABNORMAL HIGH (ref 70–99)

## 2014-06-28 MED ORDER — FUROSEMIDE 10 MG/ML IJ SOLN
INTRAMUSCULAR | Status: AC
  Filled 2014-06-28: qty 8

## 2014-06-28 MED ORDER — PANTOPRAZOLE SODIUM 40 MG PO TBEC
40.0000 mg | DELAYED_RELEASE_TABLET | Freq: Two times a day (BID) | ORAL | Status: DC
  Administered 2014-06-28 – 2014-06-29 (×3): 40 mg via ORAL
  Filled 2014-06-28 (×4): qty 1

## 2014-06-28 MED ORDER — FUROSEMIDE 10 MG/ML IJ SOLN: 20.0000 mg | Freq: Once | INTRAMUSCULAR | Status: DC

## 2014-06-28 MED ORDER — FUROSEMIDE 10 MG/ML IJ SOLN
40.0000 mg | Freq: Once | INTRAMUSCULAR | Status: AC
  Administered 2014-06-28: 40 mg via INTRAVENOUS
  Filled 2014-06-28: qty 4

## 2014-06-28 MED ORDER — METHYLPREDNISOLONE SODIUM SUCC 125 MG IJ SOLR
INTRAMUSCULAR | Status: AC
  Filled 2014-06-28: qty 2

## 2014-06-28 MED ORDER — METHYLPREDNISOLONE SODIUM SUCC 125 MG IJ SOLR
60.0000 mg | Freq: Once | INTRAMUSCULAR | Status: AC
  Administered 2014-06-28: 60 mg via INTRAVENOUS
  Filled 2014-06-28: qty 0.96

## 2014-06-28 MED ORDER — MORPHINE SULFATE 2 MG/ML IJ SOLN
2.0000 mg | INTRAMUSCULAR | Status: AC
  Administered 2014-06-28: 2 mg via INTRAVENOUS

## 2014-06-28 MED ORDER — PREDNISONE 50 MG PO TABS
60.0000 mg | ORAL_TABLET | Freq: Every day | ORAL | Status: DC
  Administered 2014-06-28 – 2014-06-29 (×2): 60 mg via ORAL
  Filled 2014-06-28 (×3): qty 1

## 2014-06-28 MED ORDER — FUROSEMIDE 10 MG/ML IJ SOLN
60.0000 mg | Freq: Once | INTRAMUSCULAR | Status: AC
  Administered 2014-06-28: 60 mg via INTRAVENOUS

## 2014-06-28 NOTE — Evaluation (Signed)
Physical Therapy Evaluation Patient Details Name: Miranda Erickson MRN: [id number] DOB: 1935/08/25 Today's Date: 06/28/2014   History of Present Illness  Pt admitted with recurrent ischemic colitis.  PMH:  HTN, tobacco use with COPD, DM, obesity, a fib and panic attacks.  Clinical Impression  Pt presents with generalized weakness and decreased activity tolerance.  Note pt very sedentary at baseline, but provided max encouragement to get up and ambulate with nursing.  Feel pt will continue to benefit from acute services to address deficits, however do not feel that she will need follow up at home.      Follow Up Recommendations No PT follow up    Equipment Recommendations  None recommended by PT    Recommendations for Other Services       Precautions / Restrictions Precautions Precautions: Fall Precaution Comments: watch sats Restrictions Weight Bearing Restrictions: No      Mobility  Bed Mobility Overal bed mobility:  (pt up in chair)             General bed mobility comments: Pt in recliner when PT arrived.   Transfers Overall transfer level: Modified independent Equipment used: Rolling walker (2 wheeled)                Ambulation/Gait Ambulation/Gait assistance: Supervision Ambulation Distance (Feet): 75 Feet Assistive device: Rolling walker (2 wheeled) Gait Pattern/deviations: Step-through pattern     General Gait Details: Pt very steady with use of RW.  Also has good knowledge that she should focus on breathing during gait.  She does require cues for pursed lip breathing, as she tends to mouth breath.  Note SaO2 on RA was 88% following half of ambulation, but quickly increased to 91% with pursed lip breathing.   Stairs            Wheelchair Mobility    Modified Rankin (Stroke Patients Only)       Balance                                             Pertinent Vitals/Pain Pain Assessment: No/denies pain Pain Score: 0-No  pain    Home Living Family/patient expects to be discharged to:: Private residence Living Arrangements: Alone Available Help at Discharge: Family;Available PRN/intermittently Type of Home: House Home Access: Stairs to enter Entrance Stairs-Rails: Right;Left;Can reach both Entrance Stairs-Number of Steps: 5 Home Layout: One level Home Equipment: Walker - 4 wheels;Shower seat;Cane - single point (may have dad's 3 in1) Additional Comments: uses a desk chair in the kitchen to avoid prolonged standing.    Prior Function Level of Independence: Needs assistance   Gait / Transfers Assistance Needed: furniture walks or uses cane inside the house, uses rollator outside  ADL's / Homemaking Assistance Needed: can prepare a simple meal, daughter does housekeeping and shopping , pt was independent ins self care  Comments: rollator does not fit down hall to bedroom     Hand Dominance   Dominant Hand: Right    Extremity/Trunk Assessment   Upper Extremity Assessment: Overall WFL for tasks assessed           Lower Extremity Assessment: Overall WFL for tasks assessed      Cervical / Trunk Assessment: Normal  Communication   Communication: No difficulties  Cognition Arousal/Alertness: Awake/alert Behavior During Therapy: WFL for tasks assessed/performed Overall Cognitive Status: Within Functional Limits for tasks assessed  General Comments      Exercises        Assessment/Plan    PT Assessment Patient needs continued PT services  PT Diagnosis Generalized weakness   PT Problem List Decreased activity tolerance;Decreased balance;Decreased mobility;Decreased strength  PT Treatment Interventions Gait training;Stair training;DME instruction;Functional mobility training;Therapeutic activities;Therapeutic exercise;Balance training;Patient/family education   PT Goals (Current goals can be found in the Care Plan section) Acute Rehab PT Goals Patient  Stated Goal: return home  PT Goal Formulation: With patient Time For Goal Achievement: 07/05/14 Potential to Achieve Goals: Good    Frequency Min 3X/week   Barriers to discharge        Co-evaluation               End of Session   Activity Tolerance: Patient tolerated treatment well;Patient limited by fatigue Patient left: in chair;with call bell/phone within reach Nurse Communication: Mobility status         Time: 4734-0370 PT Time Calculation (min): 23 min   Charges:   PT Evaluation $Initial PT Evaluation Tier I: 1 Procedure PT Treatments $Gait Training: 8-22 mins   PT G CodesDenice Bors 06/28/2014, 3:48 PM

## 2014-06-28 NOTE — Progress Notes (Signed)
Received call from [place] notifying of pt's current heart rhythm as junctional tach with rate of 115. Notified Dr. Maryland Pink of change.  Pt had been sinus rhythm on monitor. Pt is not experiencing any adverse symptoms; pt up in chair on her computer.

## 2014-06-28 NOTE — Progress Notes (Addendum)
At about 0100, RN found patient audibly wheezing, fidgeting, 89% on RA.  Family member stated patient did not seem like herself and that something "seemed wrong."  2 L placed; O2 came up to 90%.  Increased to 4 L; O2 elevated to 97%.  Albuterol breathing treatment administered; however, patient stated that she was still short of breath and sounded wheezy.  Respiratory therapist assessed patient and stated she was crackly and "wet."  RR 34.  BP 160/84.  Baltazar Najjar NP notified of situation: STAT CXR, 2 mg morphine IV, and 60 mg lasix IV ordered and given.  Afterwards, BP 127/69.  Patient confused and irritable.  Despite medication regimen, patient only improved slightly.  BP 127/69.  Still working to breathe.  Morphine 2 mg IV ordered again along with 60 mg solu-medrol IV.  After administration, patient improved substantially.  100% O2 on 4 L.  Breathing is more relaxed.  Output around 1000 cc.  Will continue to monitor.

## 2014-06-28 NOTE — Progress Notes (Signed)
Inpatient Diabetes Program Recommendations  AACE/ADA: New Consensus Statement on Inpatient Glycemic Control (2013)  Target Ranges:  Prepandial:   less than 140 mg/dL      Peak postprandial:   less than 180 mg/dL (1-2 hours)      Critically ill patients:  140 - 180 mg/dL     Results for Miranda Erickson, Miranda Erickson (MRN [id number]) as of 06/28/2014 14:09  Ref. Range 06/28/2014 07:38 06/28/2014 11:54  Glucose-Capillary Latest Range: 70-99 mg/dL 202 (H) 260 (H)     Home DM Meds: 70/30 insulin (50 units AM/ 30 units PM) + Metformin 1000 mg bid  Current Insulin orders: Novolog Sensitive SSI tid    **Patient currently getting Prednisone 60 mg daily.    **Having poor PO intake (25-50% of meals)    MD- Since patient having poor PO intake, 70/30 insulin not ideal for in-hospital use.  Patient may likely need some basal insulin added to her regimen to help better control her CBGs.   Please consider adding Lantus 20 units QHS (0.2 units/kg dosing)     Will follow Wyn Quaker RN, MSN, CDE Diabetes Coordinator Inpatient Diabetes Program Team Pager: [phone] (8a-10p)

## 2014-06-28 NOTE — Progress Notes (Signed)
[facility] PROGRESS NOTE  Miranda Erickson HER:[id number] DOB: January 03, 1935 DOA: 06/24/2014 PCP: Myriam Jacobson, MD  Brief History 79 year old with PMH significant for hypertension, diabetes mellitus, ischemic colitis in the past, atrial fibrillation not on coumadin or other anticoagulants, diastolic grade 1, admitted with abdominal pain, found to have recurrent colitis. She underwent sigmoidoscopy on 8/25 which revealed ischemic colitis. Overnight on 8/25-26 she developed shortness of breath due to pulmonary edema/COPD. Improved with Lasix and steroids.  Assessment/Plan:  Dyspnea Likely due to combination of pulmonary edema and COPD. Se is better this morning. Still has crackles. Will give additional dose of Lasix today. Daily oral steroids. She continues to smoke and has been counseled. ECHO from 2014 revealed normal systolic function. IVF probably contributed to her symptoms. These have been stopped.   Recurrent ischemic colitis  Flex Sig revealed ischemic colitis. She has had normal WBC and is afebrile. Would be reasonable to stop her antibiotics. Await further GI input.  -CT abdomen 8-22: Severe wall thickening with surrounding inflammation is seen involving the splenic flexure and descending and sigmoid colon consistent with colitis. -Multiple episodes of colitis in the last 2 years.   Hematochezia Secondary to ischemic colitis. Hb has been stable.   Diabetes mellitus Type 2  hgba1c 7.22. Continue SSI. Holding 70/30 for now.   Hypertension Continue with atenolol.   History of Atrial fibrillation Rate controlled. Not on anticoagulants. Continue with  atenolol.   Hypothyroidism:  Continue with synthroid. TSH at 1.1.   DVT Prophylaxis: SCD's Code Status: Full Code.  Family Communication: Care discussed with patient.  Disposition Plan: Not ready for discharge. Mobilize.    Consultants:  Dr Oletta Lamas, GI  Procedures: Flex Sig 8/25:  Submucosal Hemorrhages. These  are located in the sigmoid colon and her most consistent with ischemia. They're minimal ulcerations at this time. Suspect this will be all due to ischemic colitis.  Antibiotics:  Ciprofloxacin 8-22-->8/26  Flagyl 8-22--8/26  HPI/Subjective: Patient somnolent this Am due to morphine given overnight. Easily arousable. Breathing is better.   Objective: Filed Vitals:   06/28/14 0418  BP: 152/62  Pulse: 62  Temp: 97.7 F (36.5 C)  Resp: 20    Intake/Output Summary (Last 24 hours) at 06/28/14 0726 Last data filed at 06/28/14 8563  Gross per 24 hour  Intake    480 ml  Output   1000 ml  Net   -520 ml   Filed Weights   06/24/14 2011 06/25/14 2101 06/26/14 2126  Weight: 95.255 kg (210 lb) 97.2 kg (214 lb 4.6 oz) 97.2 kg (214 lb 4.6 oz)    Exam:   General:  Alert in no distress.   Cardiovascular: S 1, S 2 RRR  Respiratory: Bilateral wheezing and crackles at the bases  Abdomen: BS present, soft, generalized tenderness. Less distended.   Musculoskeletal No edema.  Data Reviewed: Basic Metabolic Panel:  Recent Labs Lab 06/24/14 0233 06/26/14 0100 06/27/14 0644 06/28/14 0540  NA 137 138 139 138  K 4.2 3.7 4.0 4.2  CL 97 101 101 99  CO2 26 24 22 26   GLUCOSE 275* 158* 147* 191*  BUN 19 10 8 10   CREATININE 1.00 0.87 0.77 0.73  CALCIUM 9.3 8.0* 8.3* 8.5   CBC:  Recent Labs Lab 06/24/14 0233 06/24/14 0759  06/25/14 0631  06/26/14 0100 06/26/14 0940 06/26/14 1856 06/27/14 0644 06/28/14 0540  WBC 11.0* 9.9  --  6.3  --   --   --   --  7.7 9.1  HGB 13.3 12.7  < > 10.5*  < > 10.9* 11.8* 11.9* 11.5* 12.3  HCT 39.9 38.2  < > 33.0*  < > 32.4* 35.8* 36.0 35.4* 37.0  MCV 92.1 91.8  --  93.5  --   --   --   --  94.9 92.3  PLT 210 211  --  185  --   --   --   --  214 226  < > = values in this interval not displayed.  CBG:  Recent Labs Lab 06/26/14 1143 06/26/14 1653 06/27/14 0800 06/27/14 1339 06/27/14 1641  GLUCAP 172* 138* 148* 228* 153*    No results  found for this or any previous visit (from the past 240 hour(s)).   Studies: Dg Chest Port 1 View  06/28/2014   CLINICAL DATA:  Wheezing.  COPD.  Follow-up CHF.  EXAM: PORTABLE CHEST - 1 VIEW  COMPARISON:  06/25/2014.  FINDINGS: Borderline enlarged cardiac silhouette with an interval decrease in size. Mild increase in prominence of the pulmonary vasculature and interstitial markings. No pleural fluid. Diffuse osteopenia.  IMPRESSION: 1. Improved cardiomegaly. 2. Mild changes of congestive heart failure superimposed on COPD.   Electronically Signed   By: Enrique Sack M.D.   On: 06/28/2014 01:12    Scheduled Meds: . amitriptyline  100 mg Oral QHS  . atenolol  100 mg Oral q morning - 10a  . furosemide  40 mg Intravenous Once  . insulin aspart  0-9 Units Subcutaneous TID WC  . levothyroxine  50 mcg Oral Daily  . pantoprazole  40 mg Oral BID  . PARoxetine  10 mg Oral Daily  . polyethylene glycol  17 g Oral Daily  . predniSONE  60 mg Oral Q breakfast   Continuous Infusions:    Active Problems:   Diabetes mellitus type 2, controlled, with complications   COPD (chronic obstructive pulmonary disease)   Hypertension   Hypothyroidism   Prior ischemic Colitis   Rectal bleeding   CKD (chronic kidney disease) stage 2, GFR 60-89 ml/min   Atrial fibrillation    Time spent: 35 minutes.     [facility] Pager [phone].   If 7PM-7AM, please contact night-coverage at [email or web address], password [facility] 06/28/2014, 7:26 AM  LOS: 4 days

## 2014-06-28 NOTE — Progress Notes (Signed)
Obtained EKG per order from Dr. Maryland Pink; rhythm of accelerated junctional with heart rate of 116. Informed MD of reading;

## 2014-06-28 NOTE — Progress Notes (Signed)
[facility] PROGRESS NOTE Subjective the patient feels much better. She is tolerating a diet and not having much in the way of abdominal pain.  Objective: Vital signs in last 24 hours: Temp:  [97.7 F (36.5 C)-98.2 F (36.8 C)] 97.7 F (36.5 C) (08/26 0926) Pulse Rate:  [55-86] 55 (08/26 0926) Resp:  [17-30] 21 (08/26 0926) BP: (110-160)/(62-84) 110/68 mmHg (08/26 0926) SpO2:  [89 %-97 %] 94 % (08/26 0926) Last BM Date: 06/27/14  Intake/Output from previous day: 08/25 0701 - 08/26 0700 In: 480 [P.O.:480] Out: 1000 [Urine:1000] Intake/Output this shift: Total I/O In: 120 [P.O.:120] Out: 750 [Urine:750]  PE: -- Abdomen-- soft with mild tenderness in the lower abdomen  Lab Results:  Recent Labs  06/26/14 0100 06/26/14 0940 06/26/14 1856 06/27/14 0644 06/28/14 0540  WBC  --   --   --  7.7 9.1  HGB 10.9* 11.8* 11.9* 11.5* 12.3  HCT 32.4* 35.8* 36.0 35.4* 37.0  PLT  --   --   --  214 226   BMET  Recent Labs  06/26/14 0100 06/27/14 0644 06/28/14 0540  NA 138 139 138  K 3.7 4.0 4.2  CL 101 101 99  CO2 24 22 26   CREATININE 0.87 0.77 0.73   LFT No results found for this basename: PROT, AST, ALT, ALKPHOS, BILITOT, BILIDIR, IBILI,  in the last 72 hours PT/INR No results found for this basename: LABPROT, INR,  in the last 72 hours PANCREAS No results found for this basename: LIPASE,  in the last 72 hours       Studies/Results: Dg Chest Port 1 View  06/28/2014   CLINICAL DATA:  Wheezing.  COPD.  Follow-up CHF.  EXAM: PORTABLE CHEST - 1 VIEW  COMPARISON:  06/25/2014.  FINDINGS: Borderline enlarged cardiac silhouette with an interval decrease in size. Mild increase in prominence of the pulmonary vasculature and interstitial markings. No pleural fluid. Diffuse osteopenia.  IMPRESSION: 1. Improved cardiomegaly. 2. Mild changes of congestive heart failure superimposed on COPD.   Electronically Signed   By: Enrique Sack M.D.   On: 06/28/2014 01:12     Medications: I have reviewed the patient's current medications.  Assessment/Plan: 1. Probable ischemic colitis. Path still pending. Patient doing well with diet. If she continues to do well I would discharge her home in follow-up with me in the office in 3 to 4 weeks. She will need to be discharged home on chronic Miralax therapy.   Markon Jares JR,Diarra Ceja L 06/28/2014, 1:31 PM

## 2014-06-28 NOTE — Evaluation (Signed)
Occupational Therapy Evaluation Patient Details Name: Miranda Erickson MRN: [id number] DOB: 03/11/1935 Today's Date: 06/28/2014    History of Present Illness Pt admitted with recurrent ischemic colitis.  PMH:  HTN, tobacco use with COPD, DM, obesity, a fib and panic attacks.   Clinical Impression   Prior to admission, pt had low activity tolerance, was sedentary and primarily homebound.  She could bathe and dress herself, prepare a simple meal, and perform simple housekeeping tasks.  She used a cane or furniture walked in her home.  Pt avoided socks.  Pt is familiar with AE for LB dressing and was instructed she could purchase in our gift shop or medical supply store.  Pt has difficulty rising from low surfaces.  Recommended 3 in 1 or riser for toilet.  Pt thinks she may have one left over from her dad. Recommend pt continue to maximize participation in self care and ambulate with nursing staff to bathroom using RW.  No further OT.  Follow Up Recommendations  No OT follow up;Supervision - Intermittent    Equipment Recommendations  3 in 1 bedside comode (if she is unable to find one she may have)    Recommendations for Other Services       Precautions / Restrictions Precautions Precautions: Fall Restrictions Weight Bearing Restrictions: No      Mobility Bed Mobility Overal bed mobility:  (pt up in chair)                Transfers Overall transfer level: Modified independent Equipment used: Rolling walker (2 wheeled)                  Balance                                            ADL Overall ADL's : At baseline                                       General ADL Comments: Pt does not routinely wear socks, uses long sponge to reach feet.  She uses slip on shoes only when she leaves home.  Pt is familiar with the use of a reacher.     Vision                     Perception     Praxis      Pertinent Vitals/Pain  Pain Assessment: No/denies pain Pain Score: 0-No pain     Hand Dominance Right   Extremity/Trunk Assessment Upper Extremity Assessment Upper Extremity Assessment: Overall WFL for tasks assessed   Lower Extremity Assessment Lower Extremity Assessment: Defer to PT evaluation       Communication Communication Communication: No difficulties   Cognition Arousal/Alertness: Awake/alert Behavior During Therapy: WFL for tasks assessed/performed Overall Cognitive Status: Within Functional Limits for tasks assessed                     General Comments       Exercises       Shoulder Instructions      Home Living Family/patient expects to be discharged to:: Private residence Living Arrangements: Alone Available Help at Discharge: Family;Available PRN/intermittently Type of Home: House Home Access: Stairs to enter Entrance Stairs-Number of Steps: 5 Entrance Stairs-Rails: Right;Left;Can reach  both Home Layout: One level     Bathroom Shower/Tub: Tub/shower unit (wide walled)   Bathroom Toilet: Standard     Home Equipment: Walker - 4 wheels;Shower seat;Cane - single point (may have dad's 3 in1)   Additional Comments: uses a desk chair in the kitchen to avoid prolonged standing.      Prior Functioning/Environment Level of Independence: Needs assistance  Gait / Transfers Assistance Needed: furniture walks or uses cane inside the house, uses rollator outside ADL's / Homemaking Assistance Needed: can prepare a simple meal, daughter does housekeeping and shopping , pt was independent ins self care   Comments: rollator does not fit down hall to bedroom    OT Diagnosis:     OT Problem List:     OT Treatment/Interventions:      OT Goals(Current goals can be found in the care plan section) Acute Rehab OT Goals Patient Stated Goal: return home   OT Frequency:     Barriers to D/C:            Co-evaluation              End of Session Equipment Utilized  During Treatment: Rolling walker  Activity Tolerance: Patient limited by fatigue Patient left: in chair;with call bell/phone within reach;with chair alarm set   Time: 1200-1300 OT Time Calculation (min): 60 min Charges:  OT General Charges $OT Visit: 1 Procedure OT Evaluation $Initial OT Evaluation Tier I: 1 Procedure OT Treatments $Self Care/Home Management : 23-37 mins G-Codes:    Malka So 06/28/2014, 1:04 PM [phone]

## 2014-06-29 ENCOUNTER — Inpatient Hospital Stay (HOSPITAL_COMMUNITY): Payer: Medicare Other

## 2014-06-29 LAB — BASIC METABOLIC PANEL
Anion gap: 13 (ref 5–15)
BUN: 16 mg/dL (ref 6–23)
CHLORIDE: 98 meq/L (ref 96–112)
CO2: 28 mEq/L (ref 19–32)
Calcium: 8.5 mg/dL (ref 8.4–10.5)
Creatinine, Ser: 0.85 mg/dL (ref 0.50–1.10)
GFR calc non Af Amer: 64 mL/min — ABNORMAL LOW (ref >90)
GFR, EST AFRICAN AMERICAN: 75 mL/min — AB (ref >90)
Glucose, Bld: 187 mg/dL — ABNORMAL HIGH (ref 70–99)
POTASSIUM: 3.8 meq/L (ref 3.7–5.3)
Sodium: 139 mEq/L (ref 137–147)

## 2014-06-29 LAB — GLUCOSE, CAPILLARY
GLUCOSE-CAPILLARY: 202 mg/dL — AB (ref 70–99)
Glucose-Capillary: 166 mg/dL — ABNORMAL HIGH (ref 70–99)
Glucose-Capillary: 143 mg/dL — ABNORMAL HIGH (ref 70–99)
Glucose-Capillary: 157 mg/dL — ABNORMAL HIGH (ref 70–99)
Glucose-Capillary: 287 mg/dL — ABNORMAL HIGH (ref 70–99)

## 2014-06-29 MED ORDER — POTASSIUM CHLORIDE ER 10 MEQ PO TBCR: 10.0000 meq | EXTENDED_RELEASE_TABLET | Freq: Every day | ORAL | Status: AC

## 2014-06-29 MED ORDER — FUROSEMIDE 20 MG PO TABS: ORAL_TABLET | ORAL | Status: AC

## 2014-06-29 MED ORDER — FUROSEMIDE 10 MG/ML IJ SOLN
40.0000 mg | Freq: Once | INTRAMUSCULAR | Status: AC
  Administered 2014-06-29: 40 mg via INTRAVENOUS
  Filled 2014-06-29: qty 4

## 2014-06-29 MED ORDER — POLYETHYLENE GLYCOL 3350 17 G PO PACK: 17.0000 g | PACK | Freq: Every day | ORAL | Status: AC

## 2014-06-29 MED ORDER — PREDNISONE 20 MG PO TABS: ORAL_TABLET | ORAL | Status: DC

## 2014-06-29 NOTE — Care Management Note (Addendum)
CARE MANAGEMENT NOTE 06/29/2014  Patient:  Miranda Erickson, Miranda Erickson   Account Number:  [id number]  Date Initiated:  06/29/2014  Documentation initiated by:  Reid Regas  Subjective/Objective Assessment:   CM following for progression and d/c planning.     Action/Plan:   06/29/2014  Met with pt re d/c needs, has walker, will speak with daughter about 3:1. Await O2 sats possible O2 needs. Pt is smoker, understands issues iwth oxygen and smoking.   Anticipated DC Date:     Anticipated DC Plan:  [place]         Choice offered to / List presented to:          [facility] arranged  HH-1 RN      [place].   Status of service:   Medicare Important Message given?  YES (If response is "NO", the following Medicare IM given date fields will be blank) Date Medicare IM given:  06/29/2014 Medicare IM given by:  Mayzee Reichenbach Date Additional Medicare IM given:   Additional Medicare IM given by:    Discharge Disposition:  [place]  Per UR Regulation:    If discussed at Long Length of Stay Meetings, dates discussed:    Comments:   06/30/2014  Late Entry, pt did not qualify for home oxygen and d/c without oxygen or HH services. Pt aware of this and states that she is in agreement and does not think that she needs HH at this time. She states that she has the support of her daughter and grandsons and the she is "very fortunate", to have their help and support.  CRoyal RN MPH

## 2014-06-29 NOTE — Progress Notes (Signed)
[facility] PROGRESS NOTE Subjective patient doing very well. Denies abdominal pain. Tolerated diet.  Objective: Vital signs in last 24 hours: Temp:  [97.5 F (36.4 C)-98.1 F (36.7 C)] 98.1 F (36.7 C) (08/27 0551) Pulse Rate:  [55-115] 82 (08/27 0551) Resp:  [20-21] 20 (08/27 0551) BP: (110-159)/(64-78) 120/64 mmHg (08/27 0551) SpO2:  [94 %-98 %] 94 % (08/27 0551) Weight:  [97.2 kg (214 lb 4.6 oz)] 97.2 kg (214 lb 4.6 oz) (08/26 2114) Last BM Date: 06/27/14  Intake/Output from previous day: 08/26 0701 - 08/27 0700 In: 840 [P.O.:840] Out: 1300 [Urine:1300] Intake/Output this shift:    PE: General-- no acute distress  Abdomen-- soft and nontender  Lab Results:  Recent Labs  06/26/14 0940 06/26/14 1856 06/27/14 0644 06/28/14 0540  WBC  --   --  7.7 9.1  HGB 11.8* 11.9* 11.5* 12.3  HCT 35.8* 36.0 35.4* 37.0  PLT  --   --  214 226   BMET  Recent Labs  06/27/14 0644 06/28/14 0540  NA 139 138  K 4.0 4.2  CL 101 99  CO2 22 26  CREATININE 0.77 0.73   LFT No results found for this basename: PROT, AST, ALT, ALKPHOS, BILITOT, BILIDIR, IBILI,  in the last 72 hours PT/INR No results found for this basename: LABPROT, INR,  in the last 72 hours PANCREAS No results found for this basename: LIPASE,  in the last 72 hours       Studies/Results: Dg Chest Port 1 View  06/28/2014   CLINICAL DATA:  Wheezing.  COPD.  Follow-up CHF.  EXAM: PORTABLE CHEST - 1 VIEW  COMPARISON:  06/25/2014.  FINDINGS: Borderline enlarged cardiac silhouette with an interval decrease in size. Mild increase in prominence of the pulmonary vasculature and interstitial markings. No pleural fluid. Diffuse osteopenia.  IMPRESSION: 1. Improved cardiomegaly. 2. Mild changes of congestive heart failure superimposed on COPD.   Electronically Signed   By: Enrique Sack M.D.   On: 06/28/2014 01:12    Medications: I have reviewed the patient's current medications.  Assessment/Plan: 1.  Probable mild ischemic colitis. Pathology shows minimal if any changes in patient is now completely asymptomatic. Should be okay to discharge home from our viewpoint. I'll be happy to see her back in the office in about 6 weeks.   Kharter Brew JR,Lirio Bach L 06/29/2014, 7:59 AM

## 2014-06-29 NOTE — Progress Notes (Signed)
Inpatient Diabetes Program Recommendations  AACE/ADA: New Consensus Statement on Inpatient Glycemic Control (2013)  Target Ranges:  Prepandial:   less than 140 mg/dL      Peak postprandial:   less than 180 mg/dL (1-2 hours)      Critically ill patients:  140 - 180 mg/dL  Results for Miranda Erickson, Miranda Erickson (MRN [id number]) as of 06/29/2014 14:08  Ref. Range 06/28/2014 17:29 06/28/2014 21:11 06/29/2014 08:01 06/29/2014 12:33  Glucose-Capillary Latest Range: 70-99 mg/dL 314 (H) 224 (H) 202 (H) 287 (H)   Inpatient Diabetes Program Recommendations Insulin - Basal: consider adding Lantus 20 units   Note: patient takes Novolin 70/30 at home.   Thank you  Raoul Pitch BSN, RN,CDE Inpatient Diabetes Coordinator [phone] (team pager)

## 2014-06-29 NOTE — Progress Notes (Signed)
SATURATION QUALIFICATIONS: (This note is used to comply with regulatory documentation for home oxygen)  Patient Saturations on Room Air at Rest =93%  Patient Saturations on Room Air while Ambulating = 91% Patient ambulated on room air and tolerated well

## 2014-06-29 NOTE — Discharge Summary (Signed)
[facility]  Physician Discharge Summary   Patient ID: Miranda Erickson MRN: [id number] DOB/AGE: 01/21/1935 79 y.o.  Admit date: 06/24/2014 Discharge date: 06/29/2014  PCP: Myriam Jacobson, MD  DISCHARGE DIAGNOSES:  Active Problems:   Diabetes mellitus type 2, controlled, with complications   COPD (chronic obstructive pulmonary disease)   Hypertension   Hypothyroidism   Prior ischemic Colitis   Rectal bleeding   CKD (chronic kidney disease) stage 2, GFR 60-89 ml/min   Atrial fibrillation   RECOMMENDATIONS FOR OUTPATIENT FOLLOW UP: 1. Needs follow up with GI in 6 weeks. 2. Needs to avoid constipation  3. Being discharged on Lasix. She wasn't aware of her home dose.   DISCHARGE CONDITION: fair  Diet recommendation: Mod Carb  Filed Weights   06/25/14 2101 06/26/14 2126 06/28/14 2114  Weight: 97.2 kg (214 lb 4.6 oz) 97.2 kg (214 lb 4.6 oz) 97.2 kg (214 lb 4.6 oz)    INITIAL HISTORY: 79 year old with PMH significant for hypertension, diabetes mellitus, ischemic colitis in the past, atrial fibrillation not on coumadin or other anticoagulants, diastolic grade 1, admitted with abdominal pain, found to have recurrent colitis. She underwent sigmoidoscopy on 8/25 which revealed ischemic colitis. Overnight on 8/25-26 she developed shortness of breath due to pulmonary edema/COPD. Improved with Lasix and steroids.  Consultations:  Dr. Oletta Lamas with [facility]  Procedures: Flex Sig 8/25:  Submucosal Hemorrhages. These are located in the sigmoid colon and her most consistent with ischemia. They're minimal ulcerations at this time. Suspect this will be all due to ischemic colitis.  Pathology: Colon Sigmoid , Recto-sigmoid UNREMARKABLE COLONIC MUCOSA AND MUCUS. NO MICROSCOPIC COLITIS, ACTIVE INFLAMMATION OR GRANULOMAS.  HOSPITAL COURSE:   Dyspnea Likely due to combination of pulmonary edema and COPD. She was given Lasix and steroids along with nebs. She improved  rapidly. She was given additional dose of Lasix today. She feels well. She ambulated and saturated well on RA. Will be discharged on Lasix and steroid taper. She continues to smoke and has been counseled. ECHO from 2014 revealed normal systolic function. IVF probably contributed to her symptoms. These have been stopped.   Recurrent ischemic colitis  She has had normal WBC and is afebrile. She was initially given antibiotics which were discontinued. GI evaluated. Flex Sig revealed ischemic colitis. CT abdomen 8-22: Severe wall thickening with surrounding inflammation is seen involving the splenic flexure and descending and sigmoid colon consistent with colitis. Pathology as above. She has had multiple episodes of colitis in the last 2 years. She was told to avoid constipation by taking laxatives.  Hematochezia  Secondary to ischemic colitis. Hb has been stable. Resolved.  Diabetes mellitus Type 2  HBA1c 7.22. Resume home medications.   Hypertension  Resume home medications.   History of Atrial fibrillation  Rate controlled. Not on anticoagulants. Continue with atenolol.   Hypothyroidism:  Continue with synthroid. TSH at 1.1.   Patient is improved. She wants to go home. She was seen by PT and has no PT requirements at home. Saturated well on Ra with ambulation. She is stable for discharge.   PERTINENT LABS:  The results of significant diagnostics from this hospitalization (including imaging, microbiology, ancillary and laboratory) are listed below for reference.     Labs: Basic Metabolic Panel:  Recent Labs Lab 06/24/14 0233 06/26/14 0100 06/27/14 0644 06/28/14 0540 06/29/14 0849  NA 137 138 139 138 139  K 4.2 3.7 4.0 4.2 3.8  CL 97 101 101 99 98  CO2 26  _0 GLUCOSE 275* 158* 147* 191* 187*  BUN _1 CREATININE 1.00 0.87 0.77 0.73 0.85  CALCIUM 9.3 8.0* 8.3* 8.5 8.5   CBC:  Recent Labs Lab 06/24/14 0233 06/24/14 0759  06/25/14 0631  06/26/14 0100  06/26/14 0940 06/26/14 1856 06/27/14 0644 06/28/14 0540  WBC 11.0* 9.9  --  6.3  --   --   --   --  7.7 9.1  HGB 13.3 12.7  < > 10.5*  < > 10.9* 11.8* 11.9* 11.5* 12.3  HCT 39.9 38.2  < > 33.0*  < > 32.4* 35.8* 36.0 35.4* 37.0  MCV 92.1 91.8  --  93.5  --   --   --   --  94.9 92.3  PLT 210 211  --  185  --   --   --   --  214 226  < > = values in this interval not displayed.  CBG:  Recent Labs Lab 06/28/14 1154 06/28/14 1729 06/28/14 2111 06/29/14 0801 06/29/14 1233  GLUCAP 260* 314* 224* 202* 287*     IMAGING STUDIES Dg Chest 2 View  06/25/2014   CLINICAL DATA:  Shortness of breath and wheezing  EXAM: CHEST  2 VIEW  COMPARISON:  PA and lateral chest of November 09, 2007  FINDINGS: The lungs are mildly hyperinflated. The interstitial markings are coarse especially inferiorly but this is not entirely new. There is no pleural effusion. The cardiac silhouette is enlarged. The central pulmonary vascularity is prominent. The bony thorax exhibits no acute abnormality.  IMPRESSION: The findings suggest low-grade CHF superimposed upon COPD. There is no pneumonia.   Electronically Signed   By: David  Martinique   On: 06/25/2014 11:10   Ct Abdomen Pelvis W Contrast  06/24/2014   CLINICAL DATA:  Abdominal pain, rectal bleeding.  EXAM: CT ABDOMEN AND PELVIS WITH CONTRAST  TECHNIQUE: Multidetector CT imaging of the abdomen and pelvis was performed using the standard protocol following bolus administration of intravenous contrast.  CONTRAST:  169m OMNIPAQUE IOHEXOL 300 MG/ML  SOLN  COMPARISON:  CT scan of July 17, 2013.  FINDINGS: Visualized lung bases appear normal. No significant osseous abnormality is noted.  Status post cholecystectomy. The liver, spleen and pancreas appear normal. Adrenal glands and kidneys appear normal. 9 mm nonobstructive calculus is noted in lower pole collecting system of right kidney. No hydronephrosis or renal obstruction is noted. Atherosclerotic calcifications of  abdominal aorta are noted without aneurysm formation. Severe wall thickening with surrounding inflammation is seen involving the splenic flexure as well as the descending and sigmoid colon consistent with colitis. Patient reportedly status post appendectomy. No small bowel obstruction is noted. No abnormal fluid collection is noted. Urinary bladder appears normal. No significant adenopathy is noted. Uterus and ovaries appear normal.  IMPRESSION: 9 mm nonobstructive calculus seen in the right kidney.  Severe wall thickening with surrounding inflammation is seen involving the splenic flexure and descending and sigmoid colon consistent with colitis.   Electronically Signed   By: JSabino DickM.D.   On: 06/24/2014 07:33   Dg Chest Port 1 View  06/29/2014   CLINICAL DATA:  Shortness of breath, cough.  EXAM: PORTABLE CHEST - 1 VIEW  COMPARISON:  June 28, 2014.  FINDINGS: The heart size and mediastinal contours are within normal limits. No pneumothorax or pleural effusion is noted. Mildly improved interstitial densities are noted consistent with improving pulmonary edema. No pneumonia or atelectasis is noted. The visualized  skeletal structures are unremarkable.  IMPRESSION: Mildly improved pulmonary edema compared to prior exam.   Electronically Signed   By: Sabino Dick M.D.   On: 06/29/2014 10:13   Dg Chest Port 1 View  06/28/2014   CLINICAL DATA:  Wheezing.  COPD.  Follow-up CHF.  EXAM: PORTABLE CHEST - 1 VIEW  COMPARISON:  06/25/2014.  FINDINGS: Borderline enlarged cardiac silhouette with an interval decrease in size. Mild increase in prominence of the pulmonary vasculature and interstitial markings. No pleural fluid. Diffuse osteopenia.  IMPRESSION: 1. Improved cardiomegaly. 2. Mild changes of congestive heart failure superimposed on COPD.   Electronically Signed   By: Enrique Sack M.D.   On: 06/28/2014 01:12    DISCHARGE EXAMINATION: Filed Vitals:   06/28/14 2114 06/29/14 0551 06/29/14 1350 06/29/14 1403   BP: 117/70 120/64    Pulse: 70 82    Temp: 97.5 F (36.4 C) 98.1 F (36.7 C)    TempSrc: Oral Oral    Resp: 20 20    Height:      Weight: 97.2 kg (214 lb 4.6 oz)     SpO2: 95% 94% 93% 93%   General appearance: alert, cooperative, appears stated age and no distress Resp: much improved air entry bilaterally. No wheezing. Few crackles at bases. Cardio: regular rate and rhythm, S1, S2 normal, no murmur, click, rub or gallop GI: soft, non-tender; bowel sounds normal; no masses,  no organomegaly Neurologic:No focal deficitsl  DISPOSITION: Home  Discharge Instructions   Diet Carb Modified    Complete by:  As directed      Discharge instructions    Complete by:  As directed   Take laxatives regularly. Avoid constipation. Follow up with Dr. Oletta Lamas.     Increase activity slowly    Complete by:  As directed            ALLERGIES:  Allergies  Allergen Reactions  . Demerol Nausea And Vomiting  . Sulfa Antibiotics Hives    Current Discharge Medication List    START taking these medications   Details  furosemide (LASIX) 20 MG tablet Take 2 tablets once daily for 5 days and then take one tablet once daily. Qty: 60 tablet, Refills: 0    polyethylene glycol (MIRALAX / GLYCOLAX) packet Take 17 g by mouth daily. Qty: 30 each, Refills: 1    potassium chloride (K-DUR) 10 MEQ tablet Take 1 tablet (10 mEq total) by mouth daily. Qty: 30 tablet, Refills: 0    predniSONE (DELTASONE) 20 MG tablet Take 3 tablets once daily for 3 days, then take 2 tablets once daily for 4 days, then take 1 tablet once daily for 4 days and then STOP Qty: 21 tablet, Refills: 0      CONTINUE these medications which have NOT CHANGED   Details  ALPRAZolam (XANAX) 0.5 MG tablet Take 0.5 mg by mouth at bedtime as needed for anxiety.    amitriptyline (ELAVIL) 100 MG tablet Take 100 mg by mouth at bedtime.    atenolol (TENORMIN) 100 MG tablet Take 100 mg by mouth every morning.     atorvastatin (LIPITOR) 20  MG tablet Take 20 mg by mouth daily.    belladonna alk-PHENObarbital (DONNATAL) 16.2 MG tablet Take 1 tablet by mouth 2 (two) times daily as needed. For IBS    Calcium Carbonate-Vitamin D (CALCIUM 600 + D PO) Take 1 tablet by mouth 2 (two) times daily.    docusate sodium (COLACE) 100 MG capsule Take 300-400 mg by mouth  daily.    insulin NPH-regular (HUMULIN 70/30) (70-30) 100 UNIT/ML injection Inject 30-50 Units into the skin 2 (two) times daily. 50 units in the morning, and 30 units in the evening    levothyroxine (SYNTHROID, LEVOTHROID) 50 MCG tablet Take 50 mcg by mouth daily.    metFORMIN (GLUCOPHAGE) 1000 MG tablet Take 1,000 mg by mouth 2 (two) times daily with a meal.    Multiple Vitamin (MULTIVITAMIN WITH MINERALS) TABS tablet Take 1 tablet by mouth daily.    ondansetron (ZOFRAN) 4 MG tablet Take 1 tablet (4 mg total) by mouth every 6 (six) hours as needed for nausea. Qty: 20 tablet, Refills: 0    pantoprazole (PROTONIX) 40 MG tablet Take 1 tablet (40 mg total) by mouth 2 (two) times daily. Qty: 30 tablet, Refills: 0    PARoxetine (PAXIL) 10 MG tablet Take 10 mg by mouth daily.    albuterol (PROVENTIL HFA;VENTOLIN HFA) 108 (90 BASE) MCG/ACT inhaler Inhale 2 puffs into the lungs every 6 (six) hours as needed. Shortness of breath      STOP taking these medications     PRESCRIPTION MEDICATION        Follow-up Information   Follow up with ROBERTS, Sharol Given, MD. Schedule an appointment as soon as possible for a visit in 1 week. (post hospitalization follow up)    Specialty:  Internal Medicine   Contact information:   [address], [place] [address] [phone]       Follow up with Vernie Ammons L, MD. Schedule an appointment as soon as possible for a visit in 6 weeks. (for colitis follow up)    Specialty:  Gastroenterology   Contact information:   [address] [place] [address] [phone]        TOTAL DISCHARGE TIME: 47 mins  [facility] Pager [phone]  06/29/2014, 3:08 PM  Disclaimer: This note was dictated with voice recognition software. Similar sounding words can inadvertently be transcribed and may not be corrected upon review.

## 2014-06-29 NOTE — Discharge Instructions (Signed)

## 2014-08-18 ENCOUNTER — Other Ambulatory Visit: Payer: Self-pay

## 2015-04-30 ENCOUNTER — Other Ambulatory Visit: Payer: Self-pay

## 2015-05-17 ENCOUNTER — Ambulatory Visit: Payer: PRIVATE HEALTH INSURANCE | Admitting: Neurology

## 2015-05-17 ENCOUNTER — Telehealth: Payer: Self-pay

## 2015-05-17 NOTE — Telephone Encounter (Signed)
Patient cancelled appointment 2 hours prior to appointment time.

## 2015-05-29 ENCOUNTER — Other Ambulatory Visit: Payer: Self-pay | Admitting: Internal Medicine

## 2015-05-29 DIAGNOSIS — S0990XA Unspecified injury of head, initial encounter: Secondary | ICD-10-CM

## 2015-05-31 ENCOUNTER — Ambulatory Visit
Admission: RE | Admit: 2015-05-31 | Discharge: 2015-05-31 | Disposition: A | Discharge status: Home / Self Care | Payer: Medicare Other | Source: Ambulatory Visit | Attending: Internal Medicine | Admitting: Internal Medicine

## 2015-05-31 ENCOUNTER — Other Ambulatory Visit: Payer: Self-pay | Admitting: Internal Medicine

## 2015-05-31 DIAGNOSIS — R0602 Shortness of breath: Secondary | ICD-10-CM

## 2015-05-31 DIAGNOSIS — S0990XA Unspecified injury of head, initial encounter: Secondary | ICD-10-CM

## 2015-06-01 ENCOUNTER — Ambulatory Visit (INDEPENDENT_AMBULATORY_CARE_PROVIDER_SITE_OTHER): Payer: Medicare Other | Admitting: Neurology

## 2015-06-01 ENCOUNTER — Encounter: Payer: Self-pay | Admitting: Neurology

## 2015-06-01 VITALS — BP 149/84 | HR 108 | Ht 61.0 in

## 2015-06-01 DIAGNOSIS — R5382 Chronic fatigue, unspecified: Secondary | ICD-10-CM | POA: Diagnosis not present

## 2015-06-01 DIAGNOSIS — E538 Deficiency of other specified B group vitamins: Secondary | ICD-10-CM

## 2015-06-01 DIAGNOSIS — R269 Unspecified abnormalities of gait and mobility: Secondary | ICD-10-CM | POA: Diagnosis not present

## 2015-06-01 HISTORY — DX: Unspecified abnormalities of gait and mobility: R26.9

## 2015-06-01 NOTE — Patient Instructions (Addendum)
We will get physical therapy for walking, and get MRI evaluation of the cervical and lumbar spine. We will get blood work today. Always use your walker when walking.   Fall Prevention and Home Safety Falls cause injuries and can affect all age groups. It is possible to use preventive measures to significantly decrease the likelihood of falls. There are many simple measures which can make your home safer and prevent falls. OUTDOORS  Repair cracks and edges of walkways and driveways.  Remove high doorway thresholds.  Trim shrubbery on the main path into your home.  Have good outside lighting.  Clear walkways of tools, rocks, debris, and clutter.  Check that handrails are not broken and are securely fastened. Both sides of steps should have handrails.  Have leaves, snow, and ice cleared regularly.  Use sand or salt on walkways during winter months.  In the garage, clean up grease or oil spills. BATHROOM  Install night lights.  Install grab bars by the toilet and in the tub and shower.  Use non-skid mats or decals in the tub or shower.  Place a plastic non-slip stool in the shower to sit on, if needed.  Keep floors dry and clean up all water on the floor immediately.  Remove soap buildup in the tub or shower on a regular basis.  Secure bath mats with non-slip, double-sided rug tape.  Remove throw rugs and tripping hazards from the floors. BEDROOMS  Install night lights.  Make sure a bedside light is easy to reach.  Do not use oversized bedding.  Keep a telephone by your bedside.  Have a firm chair with side arms to use for getting dressed.  Remove throw rugs and tripping hazards from the floor. KITCHEN  Keep handles on pots and pans turned toward the center of the stove. Use back burners when possible.  Clean up spills quickly and allow time for drying.  Avoid walking on wet floors.  Avoid hot utensils and knives.  Position shelves so they are not too  high or low.  Place commonly used objects within easy reach.  If necessary, use a sturdy step stool with a grab bar when reaching.  Keep electrical cables out of the way.  Do not use floor polish or wax that makes floors slippery. If you must use wax, use non-skid floor wax.  Remove throw rugs and tripping hazards from the floor. STAIRWAYS  Never leave objects on stairs.  Place handrails on both sides of stairways and use them. Fix any loose handrails. Make sure handrails on both sides of the stairways are as long as the stairs.  Check carpeting to make sure it is firmly attached along stairs. Make repairs to worn or loose carpet promptly.  Avoid placing throw rugs at the top or bottom of stairways, or properly secure the rug with carpet tape to prevent slippage. Get rid of throw rugs, if possible.  Have an electrician put in a light switch at the top and bottom of the stairs. OTHER FALL PREVENTION TIPS  Wear low-heel or rubber-soled shoes that are supportive and fit well. Wear closed toe shoes.  When using a stepladder, make sure it is fully opened and both spreaders are firmly locked. Do not climb a closed stepladder.  Add color or contrast paint or tape to grab bars and handrails in your home. Place contrasting color strips on first and last steps.  Learn and use mobility aids as needed. Install an electrical emergency response system.  Turn on lights to avoid dark areas. Replace light bulbs that burn out immediately. Get light switches that glow.  Arrange furniture to create clear pathways. Keep furniture in the same place.  Firmly attach carpet with non-skid or double-sided tape.  Eliminate uneven floor surfaces.  Select a carpet pattern that does not visually hide the edge of steps.  Be aware of all pets. OTHER HOME SAFETY TIPS  Set the water temperature for 120 F (48.8 C).  Keep emergency numbers on or near the telephone.  Keep smoke detectors on every level  of the home and near sleeping areas. Document Released: 10/10/2002 Document Revised: 04/20/2012 Document Reviewed: 01/09/2012 [facility] Patient Information 2015 [place], [place]. This information is not intended to replace advice given to you by your health care provider. Make sure you discuss any questions you have with your health care provider.

## 2015-06-01 NOTE — Progress Notes (Signed)
Reason for visit: Gait disturbance  Referring physician: Dr. Janeice Robinson  BRYNA RAZAVI is a 80 y.o. female  History of present illness:  Ms. Easton is a 80 year old right-handed white female with a history of problems with balance and gait over the last 2 years. Within the last 6 months, the issues have become much more significant. The patient is falling on a regular basis, she indicates that she commonly will fall up to twice a day, she recently has fallen and has struck the left eye. She has been using a walker over the last 8 months, and she generally will not fall as long as she is using a walker. The patient indicates that she frequently will not recall the actual fall itself, she wakes up on the ground. She indicates that her legs do not buckle, she will topple over. She reports some left knee discomfort, and occasionally the left knee will collapse. The patient does note chronic issues with neck and low back pain without radiation down the legs or arms. She indicates that she does not have any issues controlling the bowels or the bladder. She has a history of a left ulnar neuropathy, she has had a left ulnar nerve transposition surgery but still has residual numbness of the fourth and fifth fingers on the left hand. She has developed a memory issue over the last one year. She recently underwent a CT scan of the brain that is relatively unremarkable, no evidence of significant small vessel ischemic changes were noted. This study was reviewed on line. She has a history of sudden hearing loss on the left ear. The patient denies any burning or stinging sensations in the feet. She comes to this office for an evaluation.  Past Medical History  Diagnosis Date  . Diverticulosis 11/2003    Seen on colonoscopy  . Diabetes mellitus   . Hypertension   . COPD (chronic obstructive pulmonary disease)   . 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  Mitral valve prolapse   . Hypothyroidism   . Complication of anesthesia     demerol, vomiting  . CHF (congestive heart failure)   . Anxiety   . Hypercholesteremia   . Cancer     breast  . Granuloma annulare   . Abnormality of gait 06/01/2015    Past Surgical History  Procedure Laterality Date  . Ulnar nerve repair      Decompression of ulnar and median nerve at carpal tunnel   . Mastectomy      bilateral, s/p reconstruction  . Appendectomy    . Cholecystectomy    . Tonsillectomy    . Colonoscopy  11/14/2011    Procedure: COLONOSCOPY;  Surgeon: Winfield Cunas., MD;  Location: [facility] ENDOSCOPY;  Service: Endoscopy;  Laterality: N/A;  . Flexible sigmoidoscopy N/A 06/27/2014    Procedure: FLEXIBLE SIGMOIDOSCOPY;  Surgeon: Winfield Cunas., MD;  Location: [facility] ENDOSCOPY;  Service: Endoscopy;  Laterality: N/A;    Family History  Problem Relation Age of Onset  . Breast cancer Mother     Deceased at 3  . Parkinsonism Father     Social history:  reports that she has been smoking Cigarettes.  She has a 7.5 pack-year smoking history. She has never used smokeless tobacco. She reports that she does not drink alcohol or use illicit drugs.  Medications:  Prior to Admission medications   Medication Sig Start Date  End Date Taking? Authorizing Provider  albuterol (PROVENTIL HFA;VENTOLIN HFA) 108 (90 BASE) MCG/ACT inhaler Inhale 2 puffs into the lungs every 6 (six) hours as needed. Shortness of breath   Yes Historical Provider, MD  ALPRAZolam (XANAX) 0.5 MG tablet Take 0.5 mg by mouth at bedtime as needed for anxiety.   Yes Historical Provider, MD  amitriptyline (ELAVIL) 100 MG tablet Take 100 mg by mouth at bedtime.   Yes Historical Provider, MD  atenolol (TENORMIN) 100 MG tablet Take 100 mg by mouth every morning.    Yes Historical Provider, MD  atorvastatin (LIPITOR) 20 MG tablet Take 20 mg by mouth daily.   Yes Historical Provider, MD  Calcium Carbonate-Vitamin D (CALCIUM 600 + D PO)  Take 1 tablet by mouth 2 (two) times daily.   Yes Historical Provider, MD  docusate sodium (COLACE) 100 MG capsule Take 300-400 mg by mouth daily.   Yes Historical Provider, MD  furosemide (LASIX) 20 MG tablet Take 2 tablets once daily for 5 days and then take one tablet once daily. 06/29/14  Yes Bonnielee Haff, MD  insulin NPH-regular (HUMULIN 70/30) (70-30) 100 UNIT/ML injection Inject 30-50 Units into the skin 2 (two) times daily. 50 units in the morning, and 30 units in the evening   Yes Historical Provider, MD  levothyroxine (SYNTHROID, LEVOTHROID) 50 MCG tablet Take 50 mcg by mouth daily.   Yes Historical Provider, MD  metFORMIN (GLUCOPHAGE) 1000 MG tablet Take 1,000 mg by mouth 2 (two) times daily with a meal.   Yes Historical Provider, MD  Multiple Vitamin (MULTIVITAMIN WITH MINERALS) TABS tablet Take 1 tablet by mouth daily.   Yes Historical Provider, MD  ondansetron (ZOFRAN) 4 MG tablet Take 1 tablet (4 mg total) by mouth every 6 (six) hours as needed for nausea. 07/19/13  Yes Velvet Bathe, MD  pantoprazole (PROTONIX) 40 MG tablet Take 1 tablet (40 mg total) by mouth 2 (two) times daily. 07/19/13  Yes Velvet Bathe, MD  PARoxetine (PAXIL) 10 MG tablet Take 10 mg by mouth daily.   Yes Historical Provider, MD  polyethylene glycol (MIRALAX / GLYCOLAX) packet Take 17 g by mouth daily. 06/29/14  Yes Bonnielee Haff, MD  potassium chloride (K-DUR) 10 MEQ tablet Take 1 tablet (10 mEq total) by mouth daily. 06/29/14  Yes Bonnielee Haff, MD      Allergies  Allergen Reactions  . Demerol Nausea And Vomiting  . Sulfa Antibiotics Hives    ROS:  Out of a complete 14 system review of symptoms, the patient complains only of the following symptoms, and all other reviewed systems are negative.  Fatigue Swelling in the legs Hearing loss Blurred vision Shortness of breath, cough, wheezing Joint pain, aching muscles Runny nose Memory loss, confusion, headache, weakness, dizziness, passing out,  tremor Anxiety, too much sleep, decreased energy  Blood pressure 149/84, pulse 108, height 5\' 1"  (1.549 m).   Blood pressure, right arm, standing is 138/80.  Physical Exam  General: The patient is alert and cooperative at the time of the examination. The patient is moderately obese.  Eyes: Pupils are equal, round, and reactive to light. Discs are flat bilaterally.  Neck: The neck is supple, no carotid bruits are noted.  Respiratory: The respiratory examination is clear.  Cardiovascular: The cardiovascular examination reveals a regular rate and rhythm, no obvious murmurs or rubs are noted.  Skin: Extremities are with 3+ edema below the knees bilaterally.  Neurologic Exam  Mental status: The patient is alert and oriented x 3  at the time of the examination. The patient has apparent normal recent and remote memory, with an apparently normal attention span and concentration ability.  Cranial nerves: Facial symmetry is present. There is good sensation of the face to pinprick and soft touch bilaterally. The strength of the facial muscles and the muscles to head turning and shoulder shrug are normal bilaterally. Speech is well enunciated, no aphasia or dysarthria is noted. Extraocular movements are full. Visual fields are full. The tongue is midline, and the patient has symmetric elevation of the soft palate. No obvious hearing deficits are noted.  Motor: The motor testing reveals 5 over 5 strength of all 4 extremities. Good symmetric motor tone is noted throughout.  Sensory: Sensory testing is intact to pinprick, soft touch, vibration sensation, and position sense on all 4 extremities. No evidence of extinction is noted.  Coordination: Cerebellar testing reveals good finger-nose-finger and heel-to-shin bilaterally.  Gait and station: Gait is wide-based, unsteady. The patient uses a walker for ambulation. Tandem gait is unsteady Romberg is negative, but is unsteady.  Reflexes: Deep tendon  reflexes are symmetric, but are depressed bilaterally. Toes are downgoing bilaterally.   Assessment/Plan:  1. Chronic gait disturbance  2. Memory disturbance  The patient has had multiple falls that have increased in frequency over the last several years. She is now using a walker, she indicates that she generally does not fall while using a walker. There is some question whether or not the patient is conscious when she falls, she does not always remember the fall itself. The patient has chronic gait instability, however at all times when walking. There is no clear evidence of a peripheral neuropathy on the clinical examination, and a recent CT scan of the brain was relatively unremarkable without evidence of severe small vessel ischemic changes. The patient will be set up for MRI evaluation of the cervical spine and lumbar spine, and she will have blood work today. We may consider EMG and nerve conduction study in the future if these studies did not offer an explanation for her walking problem. Physical therapy will be set up with home health therapy for balance training. She will otherwise follow-up in about 3 months.  Jill Alexanders MD 06/02/2015 12:10 PM  [facility] [address] [place], [place] [address]  Phone [phone] Fax [phone]

## 2015-06-02 LAB — TSH: TSH: 1.15 u[IU]/mL (ref 0.450–4.500)

## 2015-06-02 LAB — CK: CK TOTAL: 91 U/L (ref 24–173)

## 2015-06-02 LAB — VITAMIN B12: VITAMIN B 12: 791 pg/mL (ref 211–946)

## 2015-06-02 LAB — RPR: RPR: NONREACTIVE

## 2015-06-02 LAB — COPPER, SERUM: Copper: 176 ug/dL — ABNORMAL HIGH (ref 72–166)

## 2015-07-19 ENCOUNTER — Telehealth: Payer: Self-pay | Admitting: Neurology

## 2015-07-19 NOTE — Telephone Encounter (Signed)
Patient is calling regarding a referral for physical therapy. The patient was seen in our office on 06-01-15 and has not been called to schedule. Please call and advise. Thank you.

## 2015-07-20 NOTE — Telephone Encounter (Signed)
Sending referral to [facility]. Waiting on Dr. Jannifer Franklin and then faxing all information and referral over. [facility] Phone number is [phone] and fax is [phone].

## 2015-07-23 NOTE — Telephone Encounter (Signed)
Left voicemail stating that we are sending her PT referral to [facility] and she should hear from them soon.

## 2015-07-23 NOTE — Telephone Encounter (Signed)
Patient called returning [facility] phone call. I relayed message to patient and gave her [facility] phone number.

## 2015-08-04 ENCOUNTER — Ambulatory Visit
Admission: RE | Admit: 2015-08-04 | Discharge: 2015-08-04 | Disposition: A | Discharge status: Home / Self Care | Payer: Medicare Other | Source: Ambulatory Visit | Attending: Neurology | Admitting: Neurology

## 2015-08-04 DIAGNOSIS — R269 Unspecified abnormalities of gait and mobility: Secondary | ICD-10-CM

## 2015-08-06 ENCOUNTER — Telehealth: Payer: Self-pay | Admitting: Neurology

## 2015-08-06 DIAGNOSIS — R269 Unspecified abnormalities of gait and mobility: Secondary | ICD-10-CM

## 2015-08-06 NOTE — Telephone Encounter (Addendum)
I called patient. MRI of the cervical spine and lumbar spine I do not believe shows anything that would cause the multiple falls the patient is having. She does report significant arthritis in the left knee, this could be a factor. I will set her up for nerve conduction studies of both legs, EMG of one leg.   MRI cervical spine 08/06/15:  IMPRESSION: This is an abnormal MRI of the cervical spine show the following: 1. Uncovertebral spurring and disc protrusion, more to the left at C6-C7 causing moderate left foraminal narrowing. Although there is no definite nerve root compression, there is encroachment upon the exiting C7 nerve root. 2. There are milder degenerative changes at C3-C4, C4-C5 and C5-C6 with less potential for nerve root impingement. 3. The spinal cord appears normal. There is no spinal stenosis.   MRI lumbar 08/06/15:  IMPRESSION: This is an abnormal MRI of the lumbar spine showed the following: 1. There is moderate spinal stenosis at L4-L5 due to severe facet hypertrophy and broad-based disc bulging. Additionally at this level, there is severe left lateral recess stenosis and moderately severe foraminal narrowing to the right. There is probable left L5 and possible right L4 nerve root compression at this level. 2. There are milder degenerative changes at other levels as detailed above that did not lead to any nerve root impingement. 3. There appears to be partial lumbarization of the S1 vertebral body.

## 2015-08-24 ENCOUNTER — Ambulatory Visit: Payer: Self-pay | Admitting: Neurology

## 2015-09-05 ENCOUNTER — Ambulatory Visit (INDEPENDENT_AMBULATORY_CARE_PROVIDER_SITE_OTHER): Payer: Medicare Other | Admitting: Neurology

## 2015-09-05 ENCOUNTER — Encounter: Payer: Self-pay | Admitting: Neurology

## 2015-09-05 VITALS — BP 134/82 | HR 78 | Resp 20 | Ht 61.0 in | Wt 185.0 lb

## 2015-09-05 DIAGNOSIS — R269 Unspecified abnormalities of gait and mobility: Secondary | ICD-10-CM

## 2015-09-05 NOTE — Patient Instructions (Addendum)
We will get EMG and NCV evaluation to test the nerves in the legs.  Fall Prevention in the Home  Falls can cause injuries and can affect people from all age groups. There are many simple things that you can do to make your home safe and to help prevent falls. WHAT CAN I DO ON THE OUTSIDE OF MY HOME?  Regularly repair the edges of walkways and driveways and fix any cracks.  Remove high doorway thresholds.  Trim any shrubbery on the main path into your home.  Use bright outdoor lighting.  Clear walkways of debris and clutter, including tools and rocks.  Regularly check that handrails are securely fastened and in good repair. Both sides of any steps should have handrails.  Install guardrails along the edges of any raised decks or porches.  Have leaves, snow, and ice cleared regularly.  Use sand or salt on walkways during winter months.  In the garage, clean up any spills right away, including grease or oil spills. WHAT CAN I DO IN THE BATHROOM?  Use night lights.  Install grab bars by the toilet and in the tub and shower. Do not use towel bars as grab bars.  Use non-skid mats or decals on the floor of the tub or shower.  If you need to sit down while you are in the shower, use a plastic, non-slip stool.[place] [facility]  Keep the floor dry. Immediately clean up any water that spills on the floor.  Remove soap buildup in the tub or shower on a regular basis.  Attach bath mats securely with double-sided non-slip rug tape.  Remove throw rugs and other tripping hazards from the floor. WHAT CAN I DO IN THE BEDROOM?  Use night lights.  Make sure that a bedside light is easy to reach.  Do not use oversized bedding that drapes onto the floor.  Have a firm chair that has side arms to use for getting dressed.  Remove throw rugs and other tripping hazards from the floor. WHAT CAN I DO IN THE KITCHEN?   Clean up any spills right away.  Avoid walking on wet floors.  Place frequently  used items in easy-to-reach places.  If you need to reach for something above you, use a sturdy step stool that has a grab bar.  Keep electrical cables out of the way.  Do not use floor polish or wax that makes floors slippery. If you have to use wax, make sure that it is non-skid floor wax.  Remove throw rugs and other tripping hazards from the floor. WHAT CAN I DO IN THE STAIRWAYS?  Do not leave any items on the stairs.  Make sure that there are handrails on both sides of the stairs. Fix handrails that are broken or loose. Make sure that handrails are as long as the stairways.  Check any carpeting to make sure that it is firmly attached to the stairs. Fix any carpet that is loose or worn.  Avoid having throw rugs at the top or bottom of stairways, or secure the rugs with carpet tape to prevent them from moving.  Make sure that you have a light switch at the top of the stairs and the bottom of the stairs. If you do not have them, have them installed. WHAT ARE SOME OTHER FALL PREVENTION TIPS?  Wear closed-toe shoes that fit well and support your feet. Wear shoes that have rubber soles or low heels.  When you use a stepladder, make sure that it is  completely opened and that the sides are firmly locked. Have someone hold the ladder while you are using it. Do not climb a closed stepladder.  Add color or contrast paint or tape to grab bars and handrails in your home. Place contrasting color strips on the first and last steps.  Use mobility aids as needed, such as canes, walkers, scooters, and crutches.  Turn on lights if it is dark. Replace any light bulbs that burn out.  Set up furniture so that there are clear paths. Keep the furniture in the same spot.  Fix any uneven floor surfaces.  Choose a carpet design that does not hide the edge of steps of a stairway.  Be aware of any and all pets.  Review your medicines with your healthcare provider. Some medicines can cause dizziness  or changes in blood pressure, which increase your risk of falling. Talk with your health care provider about other ways that you can decrease your risk of falls. This may include working with a physical therapist or trainer to improve your strength, balance, and endurance.   This information is not intended to replace advice given to you by your health care provider. Make sure you discuss any questions you have with your health care provider.   Document Released: 10/10/2002 Document Revised: 03/06/2015 Document Reviewed: 11/24/2014 [employer]vier Interactive Patient Education [employer].

## 2015-09-05 NOTE — Progress Notes (Signed)
Reason for visit:  Gait disturbance  Miranda Erickson is an 80 y.o. female  History of present illness:   Miranda Erickson is a 80 year old right-handed white female with a history of a gait disorder associated with multiple falls. The patient indicates that she has not fallen since she was seen in this office last. The patient is using a cane or a walker for ambulation. She has learned to tell when she is about ready to fall, she is able to straighten up, and prevent a fall. The patient also has reported some problems with memory that have developed over the last 6-12 months. She currently is in physical therapy , she believes that this has helped her walking some. The patient has been set up for EMG and nerve conduction study evaluation, this has not yet been done. The patient returns to this office for an evaluation. The patient continues to smoke cigarettes, she reports shortness of breath with physical activity, this may limit the amount of walking that she can do.  Past Medical History  Diagnosis Date  . Diverticulosis 11/2003    Seen on colonoscopy  . Diabetes mellitus   . Hypertension   . COPD (chronic obstructive pulmonary disease) ([place])   . 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 Mitral valve prolapse   . Hypothyroidism   . Complication of anesthesia     demerol, vomiting  . CHF (congestive heart failure) ([place])   . Anxiety   . Hypercholesteremia   . Cancer (HCC)     breast  . Granuloma annulare   . Abnormality of gait 06/01/2015    Past Surgical History  Procedure Laterality Date  . Ulnar nerve repair      Decompression of ulnar and median nerve at carpal tunnel   . Mastectomy      bilateral, s/p reconstruction  . Appendectomy    . Cholecystectomy    . Tonsillectomy    . Colonoscopy  11/14/2011    Procedure: COLONOSCOPY;  Surgeon: Winfield Cunas., MD;  Location: [facility] ENDOSCOPY;  Service: Endoscopy;  Laterality: N/A;  .  Flexible sigmoidoscopy N/A 06/27/2014    Procedure: FLEXIBLE SIGMOIDOSCOPY;  Surgeon: Winfield Cunas., MD;  Location: [facility] ENDOSCOPY;  Service: Endoscopy;  Laterality: N/A;    Family History  Problem Relation Age of Onset  . Breast cancer Mother     Deceased at 8  . Parkinsonism Father     Social history:  reports that she has been smoking Cigarettes.  She has a 7.5 pack-year smoking history. She has never used smokeless tobacco. She reports that she does not drink alcohol or use illicit drugs.    Allergies  Allergen Reactions  . Demerol Nausea And Vomiting  . Sulfa Antibiotics Hives    Medications:  Prior to Admission medications   Medication Sig Start Date End Date Taking? Authorizing Provider  albuterol (PROVENTIL HFA;VENTOLIN HFA) 108 (90 BASE) MCG/ACT inhaler Inhale 2 puffs into the lungs every 6 (six) hours as needed. Shortness of breath   Yes Historical Provider, MD  ALPRAZolam (XANAX) 0.5 MG tablet Take 0.5 mg by mouth at bedtime as needed for anxiety.   Yes Historical Provider, MD  amitriptyline (ELAVIL) 100 MG tablet Take 100 mg by mouth at bedtime.   Yes Historical Provider, MD  atenolol (TENORMIN) 100 MG tablet Take 100 mg by mouth every morning.    Yes Historical Provider, MD  atorvastatin (LIPITOR) 20 MG tablet Take 20 mg by mouth daily.   Yes Historical Provider, MD  Calcium Carbonate-Vitamin D (CALCIUM 600 + D PO) Take 1 tablet by mouth 2 (two) times daily.   Yes Historical Provider, MD  docusate sodium (COLACE) 100 MG capsule Take 300-400 mg by mouth daily.   Yes Historical Provider, MD  furosemide (LASIX) 20 MG tablet Take 2 tablets once daily for 5 days and then take one tablet once daily. 06/29/14  Yes Bonnielee Haff, MD  insulin NPH-regular (HUMULIN 70/30) (70-30) 100 UNIT/ML injection Inject 30-50 Units into the skin 2 (two) times daily. 50 units in the morning, and 30 units in the evening   Yes Historical Provider, MD  levothyroxine (SYNTHROID, LEVOTHROID) 50  MCG tablet Take 50 mcg by mouth daily.   Yes Historical Provider, MD  metFORMIN (GLUCOPHAGE) 1000 MG tablet Take 1,000 mg by mouth 2 (two) times daily with a meal.   Yes Historical Provider, MD  Multiple Vitamin (MULTIVITAMIN WITH MINERALS) TABS tablet Take 1 tablet by mouth daily.   Yes Historical Provider, MD  PARoxetine (PAXIL) 10 MG tablet Take 10 mg by mouth daily.   Yes Historical Provider, MD  polyethylene glycol (MIRALAX / GLYCOLAX) packet Take 17 g by mouth daily. 06/29/14  Yes Bonnielee Haff, MD  potassium chloride (K-DUR) 10 MEQ tablet Take 1 tablet (10 mEq total) by mouth daily. 06/29/14  Yes Bonnielee Haff, MD  fluconazole (DIFLUCAN) 150 MG tablet  08/26/15   Historical Provider, MD  nystatin cream (MYCOSTATIN)  08/26/15   Historical Provider, MD  omeprazole (PRILOSEC) 20 MG capsule  07/20/15   Historical Provider, MD  ondansetron (ZOFRAN) 4 MG tablet Take 1 tablet (4 mg total) by mouth every 6 (six) hours as needed for nausea. Patient not taking: Reported on 09/05/2015 07/19/13   Velvet Bathe, MD  pantoprazole (PROTONIX) 40 MG tablet Take 1 tablet (40 mg total) by mouth 2 (two) times daily. Patient not taking: Reported on 09/05/2015 07/19/13   Velvet Bathe, MD    ROS:  Out of a complete 14 system review of symptoms, the patient complains only of the following symptoms, and all other reviewed systems are negative.   Hearing loss, runny nose  Shortness of breath  Leg swelling  Diarrhea  Environmental allergies  Knee discomfort, left , walking difficulties, neck stiffness  Memory loss, headache, weakness  Blood pressure 134/82, pulse 78, resp. rate 20, height 5\' 1"  (1.549 m), weight 185 lb (83.915 kg).  Physical Exam  General: The patient is alert and cooperative at the time of the examination.  Skin:  3+ edema below the knees is noted bilaterally.   Neurologic Exam  Mental status: The patient is alert and oriented x 3 at the time of the examination. The patient has apparent  normal recent and remote memory, with an apparently normal attention span and concentration ability. Mini-Mental Status Examination done today shows a total score 28/30. The patient is able to name 13 four-legged animals in 30 seconds.   Cranial nerves: Facial symmetry is present. Speech is normal, no aphasia or dysarthria is noted. Extraocular movements are full. Visual fields are full.  Motor: The patient has good strength in all 4 extremities.  Sensory examination: Soft touch sensation is symmetric on the face, arms, and legs. The patient has a stocking pattern pinprick sensory deficit up to the knees bilaterally.  Coordination: The patient has good finger-nose-finger and heel-to-shin bilaterally.  Gait and station: The patient has a slightly  wide-based gait, the patient is able to ambulate with a cane. Tandem gait was not attempted. Romberg is negative. No drift is seen.  Reflexes: Deep tendon reflexes are symmetric.   Assessment/Plan:   1. Gait disorder   2. Memory disorder   I discussed the potential for treating the memory issue with Aricept, the patient does not wish to go on a medication at this time. The patient will be set up for EMG nerve conduction study evaluation to look for evidence of a peripheral neuropathy. The patient will be followed for the memory, she will be seen for the EMG evaluation. She will continue the physical therapy.  Jill Alexanders MD 09/05/2015 6:27 PM  [facility] [address] [place], [place] [address]  Phone [phone] Fax [phone]

## 2015-09-11 ENCOUNTER — Encounter (HOSPITAL_COMMUNITY): Payer: Self-pay | Admitting: Emergency Medicine

## 2015-09-11 ENCOUNTER — Emergency Department (HOSPITAL_COMMUNITY)
Admission: EM | Admit: 2015-09-11 | Discharge: 2015-09-11 | Disposition: A | Discharge status: Home / Self Care | Payer: Medicare Other | Attending: Emergency Medicine | Admitting: Emergency Medicine

## 2015-09-11 ENCOUNTER — Emergency Department (HOSPITAL_COMMUNITY): Payer: Medicare Other

## 2015-09-11 DIAGNOSIS — I509 Heart failure, unspecified: Secondary | ICD-10-CM | POA: Insufficient documentation

## 2015-09-11 DIAGNOSIS — Z872 Personal history of diseases of the skin and subcutaneous tissue: Secondary | ICD-10-CM | POA: Insufficient documentation

## 2015-09-11 DIAGNOSIS — Y9301 Activity, walking, marching and hiking: Secondary | ICD-10-CM | POA: Insufficient documentation

## 2015-09-11 DIAGNOSIS — E7800 Pure hypercholesterolemia, unspecified: Secondary | ICD-10-CM | POA: Diagnosis not present

## 2015-09-11 DIAGNOSIS — I10 Essential (primary) hypertension: Secondary | ICD-10-CM | POA: Diagnosis not present

## 2015-09-11 DIAGNOSIS — I341 Nonrheumatic mitral (valve) prolapse: Secondary | ICD-10-CM | POA: Insufficient documentation

## 2015-09-11 DIAGNOSIS — F419 Anxiety disorder, unspecified: Secondary | ICD-10-CM | POA: Insufficient documentation

## 2015-09-11 DIAGNOSIS — S79912A Unspecified injury of left hip, initial encounter: Secondary | ICD-10-CM | POA: Diagnosis not present

## 2015-09-11 DIAGNOSIS — Y998 Other external cause status: Secondary | ICD-10-CM | POA: Diagnosis not present

## 2015-09-11 DIAGNOSIS — Z79899 Other long term (current) drug therapy: Secondary | ICD-10-CM | POA: Diagnosis not present

## 2015-09-11 DIAGNOSIS — Z853 Personal history of malignant neoplasm of breast: Secondary | ICD-10-CM | POA: Insufficient documentation

## 2015-09-11 DIAGNOSIS — Y92001 Dining room of unspecified non-institutional (private) residence as the place of occurrence of the external cause: Secondary | ICD-10-CM | POA: Insufficient documentation

## 2015-09-11 DIAGNOSIS — E039 Hypothyroidism, unspecified: Secondary | ICD-10-CM | POA: Insufficient documentation

## 2015-09-11 DIAGNOSIS — J449 Chronic obstructive pulmonary disease, unspecified: Secondary | ICD-10-CM | POA: Insufficient documentation

## 2015-09-11 DIAGNOSIS — W010XXA Fall on same level from slipping, tripping and stumbling without subsequent striking against object, initial encounter: Secondary | ICD-10-CM | POA: Diagnosis not present

## 2015-09-11 DIAGNOSIS — E119 Type 2 diabetes mellitus without complications: Secondary | ICD-10-CM | POA: Diagnosis not present

## 2015-09-11 DIAGNOSIS — Z720 Tobacco use: Secondary | ICD-10-CM | POA: Insufficient documentation

## 2015-09-11 DIAGNOSIS — Z794 Long term (current) use of insulin: Secondary | ICD-10-CM | POA: Diagnosis not present

## 2015-09-11 DIAGNOSIS — S335XXA Sprain of ligaments of lumbar spine, initial encounter: Secondary | ICD-10-CM | POA: Insufficient documentation

## 2015-09-11 DIAGNOSIS — M25552 Pain in left hip: Secondary | ICD-10-CM

## 2015-09-11 DIAGNOSIS — S3992XA Unspecified injury of lower back, initial encounter: Secondary | ICD-10-CM | POA: Diagnosis present

## 2015-09-11 MED ORDER — MORPHINE SULFATE (PF) 4 MG/ML IV SOLN
4.0000 mg | Freq: Once | INTRAVENOUS | Status: AC
  Administered 2015-09-11: 4 mg via INTRAMUSCULAR
  Filled 2015-09-11: qty 1

## 2015-09-11 MED ORDER — HYDROCODONE-ACETAMINOPHEN 5-325 MG PO TABS: 1.0000 | ORAL_TABLET | ORAL | Status: AC | PRN

## 2015-09-11 MED ORDER — METHOCARBAMOL 500 MG PO TABS: 500.0000 mg | ORAL_TABLET | Freq: Two times a day (BID) | ORAL | Status: AC

## 2015-09-11 NOTE — ED Provider Notes (Signed)
CSN: [id number]     Arrival date & time 09/11/15  1935 History   First MD Initiated Contact with Patient 09/11/15 2019     Chief Complaint  Patient presents with  . Fall     (Consider location/radiation/quality/duration/timing/severity/associated sxs/prior Treatment) HPI Comments: Patient here complaining of pain to her left iliac crest which occurred after she tripped and fell prior to arrival. Denies any head injury. Denies any neck pain. No chest or abdominal discomfort. Describes sharp pain at her lower lumbar sacral spine as well as left iliac crest is worse with movement. Denies any hip pain. No left foot numbness or tingling. Symptoms better with rest and not moving. No treatment used prior to arrival  Patient is a 80 y.o. female presenting with fall. The history is provided by the patient.  Fall    Past Medical History  Diagnosis Date  . Diverticulosis 11/2003    Seen on colonoscopy  . Diabetes mellitus   . Hypertension   . COPD (chronic obstructive pulmonary disease) ([place])   . 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 Mitral valve prolapse   . Hypothyroidism   . Complication of anesthesia     demerol, vomiting  . CHF (congestive heart failure) ([place])   . Anxiety   . Hypercholesteremia   . Cancer (HCC)     breast  . Granuloma annulare   . Abnormality of gait 06/01/2015   Past Surgical History  Procedure Laterality Date  . Ulnar nerve repair      Decompression of ulnar and median nerve at carpal tunnel   . Mastectomy      bilateral, s/p reconstruction  . Appendectomy    . Cholecystectomy    . Tonsillectomy    . Colonoscopy  11/14/2011    Procedure: COLONOSCOPY;  Surgeon: Winfield Cunas., MD;  Location: [facility] ENDOSCOPY;  Service: Endoscopy;  Laterality: N/A;  . Flexible sigmoidoscopy N/A 06/27/2014    Procedure: FLEXIBLE SIGMOIDOSCOPY;  Surgeon: Winfield Cunas., MD;  Location: [facility] ENDOSCOPY;  Service: Endoscopy;   Laterality: N/A;   Family History  Problem Relation Age of Onset  . Breast cancer Mother     Deceased at 77  . Parkinsonism Father    Social History  Substance Use Topics  . Smoking status: Current Every Day Smoker -- 0.50 packs/day for 15 years    Types: Cigarettes  . Smokeless tobacco: Never Used  . Alcohol Use: No   OB History    No data available     Review of Systems  All other systems reviewed and are negative.     Allergies  Demerol and Sulfa antibiotics  Home Medications   Prior to Admission medications   Medication Sig Start Date End Date Taking? Authorizing Provider  albuterol (PROVENTIL HFA;VENTOLIN HFA) 108 (90 BASE) MCG/ACT inhaler Inhale 2 puffs into the lungs every 6 (six) hours as needed. Shortness of breath    Historical Provider, MD  ALPRAZolam Duanne Moron) 0.5 MG tablet Take 0.5 mg by mouth at bedtime as needed for anxiety.    Historical Provider, MD  amitriptyline (ELAVIL) 100 MG tablet Take 100 mg by mouth at bedtime.    Historical Provider, MD  atenolol (TENORMIN) 100 MG tablet Take 100 mg by mouth every morning.     Historical Provider, MD  atorvastatin (LIPITOR) 20 MG tablet Take 20 mg by mouth daily.    Historical Provider, MD  Calcium Carbonate-Vitamin  D (CALCIUM 600 + D PO) Take 1 tablet by mouth 2 (two) times daily.    Historical Provider, MD  docusate sodium (COLACE) 100 MG capsule Take 300-400 mg by mouth daily.    Historical Provider, MD  fluconazole (DIFLUCAN) 150 MG tablet  08/26/15   Historical Provider, MD  furosemide (LASIX) 20 MG tablet Take 2 tablets once daily for 5 days and then take one tablet once daily. 06/29/14   Bonnielee Haff, MD  insulin NPH-regular (HUMULIN 70/30) (70-30) 100 UNIT/ML injection Inject 30-50 Units into the skin 2 (two) times daily. 50 units in the morning, and 30 units in the evening    Historical Provider, MD  levothyroxine (SYNTHROID, LEVOTHROID) 50 MCG tablet Take 50 mcg by mouth daily.    Historical Provider, MD    metFORMIN (GLUCOPHAGE) 1000 MG tablet Take 1,000 mg by mouth 2 (two) times daily with a meal.    Historical Provider, MD  Multiple Vitamin (MULTIVITAMIN WITH MINERALS) TABS tablet Take 1 tablet by mouth daily.    Historical Provider, MD  nystatin cream (MYCOSTATIN)  08/26/15   Historical Provider, MD  omeprazole (PRILOSEC) 20 MG capsule  07/20/15   Historical Provider, MD  ondansetron (ZOFRAN) 4 MG tablet Take 1 tablet (4 mg total) by mouth every 6 (six) hours as needed for nausea. Patient not taking: Reported on 09/05/2015 07/19/13   Velvet Bathe, MD  pantoprazole (PROTONIX) 40 MG tablet Take 1 tablet (40 mg total) by mouth 2 (two) times daily. Patient not taking: Reported on 09/05/2015 07/19/13   Velvet Bathe, MD  PARoxetine (PAXIL) 10 MG tablet Take 10 mg by mouth daily.    Historical Provider, MD  polyethylene glycol (MIRALAX / GLYCOLAX) packet Take 17 g by mouth daily. 06/29/14   Bonnielee Haff, MD  potassium chloride (K-DUR) 10 MEQ tablet Take 1 tablet (10 mEq total) by mouth daily. 06/29/14   Bonnielee Haff, MD   BP 112/83 mmHg  Pulse 99  Temp(Src) 98.5 F (36.9 C) (Oral)  Resp 18  SpO2 99% Physical Exam  Constitutional: She is oriented to person, place, and time. She appears well-developed and well-nourished.  Non-toxic appearance. No distress.  HENT:  Head: Normocephalic and atraumatic.  Eyes: Conjunctivae, EOM and lids are normal. Pupils are equal, round, and reactive to light.  Neck: Normal range of motion. Neck supple. No tracheal deviation present. No thyroid mass present.  Cardiovascular: Normal rate, regular rhythm and normal heart sounds.  Exam reveals no gallop.   No murmur heard. Pulmonary/Chest: Effort normal and breath sounds normal. No stridor. No respiratory distress. She has no decreased breath sounds. She has no wheezes. She has no rhonchi. She has no rales.  Abdominal: Soft. Normal appearance and bowel sounds are normal. She exhibits no distension. There is no  tenderness. There is no rebound and no CVA tenderness.  Musculoskeletal: Normal range of motion. She exhibits no edema or tenderness.       Legs: Neurological: She is alert and oriented to person, place, and time. She has normal strength. No cranial nerve deficit or sensory deficit. GCS eye subscore is 4. GCS verbal subscore is 5. GCS motor subscore is 6.  Skin: Skin is warm and dry. No abrasion and no rash noted.  Psychiatric: She has a normal mood and affect. Her speech is normal and behavior is normal.  Nursing note and vitals reviewed.   ED Course  Procedures (including critical care time) Labs Review Labs Reviewed - No data to display  Imaging  Review No results found. I have personally reviewed and evaluated these images and lab results as part of my medical decision-making.   EKG Interpretation None      MDM   Final diagnoses:  None    Patient given pain medication here feels better. X-rays reviewed and are without acute findings. Patient stable for discharge    Lacretia Leigh, MD 09/11/15 2238

## 2015-09-11 NOTE — Discharge Instructions (Signed)

## 2015-09-11 NOTE — ED Notes (Signed)
MD at bedside. 

## 2015-09-11 NOTE — ED Notes (Addendum)
Per EMS patient fell while walking into house with cane at 1900 today.  Reports she landed on left hip.  Reports no pain as long as not moving it.  No LOC.  Reports pain to left lower back.

## 2015-09-11 NOTE — ED Notes (Signed)
Bed: CJ67 Expected date:  Expected time:  Means of arrival:  Comments: 80 yo F  Fall, left hip pain

## 2015-09-18 ENCOUNTER — Ambulatory Visit (INDEPENDENT_AMBULATORY_CARE_PROVIDER_SITE_OTHER): Payer: Medicare Other | Admitting: Neurology

## 2015-09-18 ENCOUNTER — Encounter: Payer: Self-pay | Admitting: Neurology

## 2015-09-18 VITALS — BP 154/86 | HR 86 | Ht 62.0 in | Wt 203.5 lb

## 2015-09-18 DIAGNOSIS — R269 Unspecified abnormalities of gait and mobility: Secondary | ICD-10-CM | POA: Diagnosis not present

## 2015-09-18 DIAGNOSIS — I482 Chronic atrial fibrillation, unspecified: Secondary | ICD-10-CM

## 2015-09-18 NOTE — Progress Notes (Signed)
Reason for visit: Gait disturbance  Miranda Erickson is an 80 y.o. female  History of present illness:  Miranda Erickson is a 80 year old right-handed white female with a history of a gait disturbance. The patient had not fallen for some time, but she apparently fell while at home on 09/11/2015. The patient was going back inside off the front porch, and then apparently fell. The patient has no recollection of the fall, she went backwards. EMS apparently was called and the patient went to the hospital. The patient had x-rays of the low back and the hips, no fractures were noted. The patient is sore particularly in the left back and hip area. The patient has other medical issues including chronic shortness of breath, congestive heart failure ongoing tobacco abuse. The patient uses a walker for ambulation, she fell with a walker. She indicates that with her other falls, she does not have full recollection of the events around the time of the fall. None of her falls have been witnessed. She returns for further evaluation. In the past she has undergone physical therapy which was beneficial for her walking.  Past Medical History  Diagnosis Date  . Diverticulosis 11/2003    Seen on colonoscopy  . Diabetes mellitus   . Hypertension   . COPD (chronic obstructive pulmonary disease) ([place])   . 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 Mitral valve prolapse   . Hypothyroidism   . Complication of anesthesia     demerol, vomiting  . CHF (congestive heart failure) ([place])   . Anxiety   . Hypercholesteremia   . Cancer (HCC)     breast  . Granuloma annulare   . Abnormality of gait 06/01/2015    Past Surgical History  Procedure Laterality Date  . Ulnar nerve repair      Decompression of ulnar and median nerve at carpal tunnel   . Mastectomy      bilateral, s/p reconstruction  . Appendectomy    . Cholecystectomy    . Tonsillectomy    . Colonoscopy   11/14/2011    Procedure: COLONOSCOPY;  Surgeon: Winfield Cunas., MD;  Location: [facility] ENDOSCOPY;  Service: Endoscopy;  Laterality: N/A;  . Flexible sigmoidoscopy N/A 06/27/2014    Procedure: FLEXIBLE SIGMOIDOSCOPY;  Surgeon: Winfield Cunas., MD;  Location: [facility] ENDOSCOPY;  Service: Endoscopy;  Laterality: N/A;    Family History  Problem Relation Age of Onset  . Breast cancer Mother     Deceased at 41  . Parkinsonism Father     Social history:  reports that she has been smoking Cigarettes.  She has a 7.5 pack-year smoking history. She has never used smokeless tobacco. She reports that she does not drink alcohol or use illicit drugs.    Allergies  Allergen Reactions  . Demerol Nausea And Vomiting  . Sulfa Antibiotics Hives    Medications:  Prior to Admission medications   Medication Sig Start Date End Date Taking? Authorizing Provider  albuterol (PROVENTIL HFA;VENTOLIN HFA) 108 (90 BASE) MCG/ACT inhaler Inhale 2 puffs into the lungs every 6 (six) hours as needed. Shortness of breath   Yes Historical Provider, MD  ALPRAZolam (XANAX) 0.5 MG tablet Take 0.5 mg by mouth 2 (two) times daily as needed for anxiety.    Yes Historical Provider, MD  amitriptyline (ELAVIL) 100 MG tablet Take 100 mg by mouth at bedtime.   Yes Historical Provider, MD  atenolol (TENORMIN) 100 MG tablet Take 100 mg by mouth every morning.    Yes Historical Provider, MD  atorvastatin (LIPITOR) 20 MG tablet Take 20 mg by mouth daily.   Yes Historical Provider, MD  Calcium Carbonate-Vitamin D (CALCIUM 600 + D PO) Take 1 tablet by mouth 2 (two) times daily.   Yes Historical Provider, MD  furosemide (LASIX) 20 MG tablet Take 2 tablets once daily for 5 days and then take one tablet once daily. 06/29/14  Yes Bonnielee Haff, MD  HYDROcodone-acetaminophen (NORCO/VICODIN) 5-325 MG tablet Take 1-2 tablets by mouth every 4 (four) hours as needed. 09/11/15  Yes Lacretia Leigh, MD  insulin NPH-regular (HUMULIN 70/30) (70-30) 100  UNIT/ML injection Inject 30-50 Units into the skin 2 (two) times daily. 50 units in the morning, and 30 units in the evening   Yes Historical Provider, MD  levothyroxine (SYNTHROID, LEVOTHROID) 50 MCG tablet Take 50 mcg by mouth daily.   Yes Historical Provider, MD  metFORMIN (GLUCOPHAGE) 1000 MG tablet Take 1,000 mg by mouth 2 (two) times daily with a meal.   Yes Historical Provider, MD  methocarbamol (ROBAXIN) 500 MG tablet Take 1 tablet (500 mg total) by mouth 2 (two) times daily. 09/11/15  Yes Lacretia Leigh, MD  Multiple Vitamin (MULTIVITAMIN WITH MINERALS) TABS tablet Take 1 tablet by mouth daily.   Yes Historical Provider, MD  nystatin cream (MYCOSTATIN) Apply 1 application topically 2 (two) times daily as needed for dry skin.  08/26/15  Yes Historical Provider, MD  omeprazole (PRILOSEC) 20 MG capsule Take 20 mg by mouth daily.  07/20/15  Yes Historical Provider, MD  ondansetron (ZOFRAN) 4 MG tablet Take 1 tablet (4 mg total) by mouth every 6 (six) hours as needed for nausea. 07/19/13  Yes Velvet Bathe, MD  PARoxetine (PAXIL) 40 MG tablet Take 1 tablet by mouth daily. 07/20/15  Yes Historical Provider, MD  polyethylene glycol (MIRALAX / GLYCOLAX) packet Take 17 g by mouth daily. Patient taking differently: Take 17 g by mouth daily as needed for mild constipation.  06/29/14  Yes Bonnielee Haff, MD  potassium chloride (K-DUR) 10 MEQ tablet Take 1 tablet (10 mEq total) by mouth daily. 06/29/14  Yes Bonnielee Haff, MD    ROS:  Out of a complete 14 system review of symptoms, the patient complains only of the following symptoms, and all other reviewed systems are negative.  Back pain Gait instability Leg swelling  Blood pressure 154/86, pulse 86, height 5\' 2"  (1.575 m), weight 203 lb 8 oz (92.307 kg).  Physical Exam  General: The patient is alert and cooperative at the time of the examination.  Skin: 2+ edema below the knees is noted bilaterally..   Neurologic Exam  Mental status: The  patient is alert and oriented x 3 at the time of the examination. The patient has apparent normal recent and remote memory, with an apparently normal attention span and concentration ability.   Cranial nerves: Facial symmetry is present. Speech is normal, no aphasia or dysarthria is noted. Extraocular movements are full. Visual fields are full.  Motor: The patient has good strength in all 4 extremities.  Sensory examination: Soft touch sensation is symmetric on the face, arms, and legs.  Coordination: The patient has good finger-nose-finger, but has difficulty performing the shin bilaterally.  Gait and station: The patient walks with a walker, gait is slightly wide-based. The patient has difficulty walking independently with significant gait instability. Tandem gait was not attempted. The patient is short winded with  minimal effort. Romberg is negative. No drift is seen.  Reflexes: Deep tendon reflexes are symmetric.   Assessment/Plan:  1. Gait instability, recent fall  The patient has had multiple falls that have been unwitnessed. The patient is not clear whether she may have some alteration of consciousness around the time of the fall. She does not remember the actual fall. For this reason, the patient will undergo EEG evaluation and a 30 day cardiac monitor study. The patient does have a history of atrial fibrillation. She has significant shortness of breath with minimal physical effort. She will follow-up with EMG and nerve conduction study evaluation that was ordered.  Jill Alexanders MD 09/18/2015 6:53 PM  [facility] [address] [place], [place] [address]  Phone [phone] Fax [phone]

## 2015-09-18 NOTE — Patient Instructions (Signed)
We will get an EEG study and a cardiac monitor study for evaluation for the falls.   Fall Prevention in the Home  Falls can cause injuries. They can happen to people of all ages. There are many things you can do to make your home safe and to help prevent falls.  WHAT CAN I DO ON THE OUTSIDE OF MY HOME?  Regularly fix the edges of walkways and driveways and fix any cracks.  Remove anything that might make you trip as you walk through a door, such as a raised step or threshold.  Trim any bushes or trees on the path to your home.  Use bright outdoor lighting.  Clear any walking paths of anything that might make someone trip, such as rocks or tools.  Regularly check to see if handrails are loose or broken. Make sure that both sides of any steps have handrails.  Any raised decks and porches should have guardrails on the edges.  Have any leaves, snow, or ice cleared regularly.  Use sand or salt on walking paths during winter.  Clean up any spills in your garage right away. This includes oil or grease spills. WHAT CAN I DO IN THE BATHROOM?   Use night lights.  Install grab bars by the toilet and in the tub and shower. Do not use towel bars as grab bars.  Use non-skid mats or decals in the tub or shower.  If you need to sit down in the shower, use a plastic, non-slip stool.  Keep the floor dry. Clean up any water that spills on the floor as soon as it happens.  Remove soap buildup in the tub or shower regularly.  Attach bath mats securely with double-sided non-slip rug tape.  Do not have throw rugs and other things on the floor that can make you trip. WHAT CAN I DO IN THE BEDROOM?  Use night lights.  Make sure that you have a light by your bed that is easy to reach.  Do not use any sheets or blankets that are too big for your bed. They should not hang down onto the floor.  Have a firm chair that has side arms. You can use this for support while you get dressed.  Do not  have throw rugs and other things on the floor that can make you trip. WHAT CAN I DO IN THE KITCHEN?  Clean up any spills right away.  Avoid walking on wet floors.  Keep items that you use a lot in easy-to-reach places.  If you need to reach something above you, use a strong step stool that has a grab bar.  Keep electrical cords out of the way.  Do not use floor polish or wax that makes floors slippery. If you must use wax, use non-skid floor wax.  Do not have throw rugs and other things on the floor that can make you trip. WHAT CAN I DO WITH MY STAIRS?  Do not leave any items on the stairs.  Make sure that there are handrails on both sides of the stairs and use them. Fix handrails that are broken or loose. Make sure that handrails are as long as the stairways.  Check any carpeting to make sure that it is firmly attached to the stairs. Fix any carpet that is loose or worn.  Avoid having throw rugs at the top or bottom of the stairs. If you do have throw rugs, attach them to the floor with carpet tape.  Make sure  that you have a light switch at the top of the stairs and the bottom of the stairs. If you do not have them, ask someone to add them for you. WHAT ELSE CAN I DO TO HELP PREVENT FALLS?  Wear shoes that:  Do not have high heels.  Have rubber bottoms.  Are comfortable and fit you well.  Are closed at the toe. Do not wear sandals.  If you use a stepladder:  Make sure that it is fully opened. Do not climb a closed stepladder.  Make sure that both sides of the stepladder are locked into place.  Ask someone to hold it for you, if possible.  Clearly mark and make sure that you can see:  Any grab bars or handrails.  First and last steps.  Where the edge of each step is.  Use tools that help you move around (mobility aids) if they are needed. These include:  Canes.  Walkers.  Scooters.  Crutches.  Turn on the lights when you go into a dark area. Replace  any light bulbs as soon as they burn out.  Set up your furniture so you have a clear path. Avoid moving your furniture around.  If any of your floors are uneven, fix them.  If there are any pets around you, be aware of where they are.  Review your medicines with your doctor. Some medicines can make you feel dizzy. This can increase your chance of falling. Ask your doctor what other things that you can do to help prevent falls.   This information is not intended to replace advice given to you by your health care provider. Make sure you discuss any questions you have with your health care provider.   Document Released: 08/16/2009 Document Revised: 03/06/2015 Document Reviewed: 11/24/2014 Elsevier Interactive Patient Education [employer].

## 2015-09-21 ENCOUNTER — Telehealth: Payer: Self-pay | Admitting: Neurology

## 2015-09-21 NOTE — Telephone Encounter (Signed)
Events noted

## 2015-09-21 NOTE — Telephone Encounter (Signed)
Trixie Dredge PT with [facility] called sts pt refused last 2 visits -one with PT assistant and last with him. He spoke with pt and she was pleasant but would not let him come out and do formal discharge. He sts knowing the pt all she does is sleep all day. He thinks she stays up most of the night and sleeps most of the day. He will be doing a non-visit discharge and send that to [place] today.

## 2015-10-03 ENCOUNTER — Ambulatory Visit (INDEPENDENT_AMBULATORY_CARE_PROVIDER_SITE_OTHER): Payer: Medicare Other

## 2015-10-03 DIAGNOSIS — I482 Chronic atrial fibrillation, unspecified: Secondary | ICD-10-CM

## 2015-10-03 DIAGNOSIS — R269 Unspecified abnormalities of gait and mobility: Secondary | ICD-10-CM | POA: Diagnosis not present

## 2015-10-18 ENCOUNTER — Telehealth: Payer: Self-pay | Admitting: Neurology

## 2015-10-18 ENCOUNTER — Ambulatory Visit (INDEPENDENT_AMBULATORY_CARE_PROVIDER_SITE_OTHER): Payer: Self-pay | Admitting: Neurology

## 2015-10-18 ENCOUNTER — Encounter: Payer: Self-pay | Admitting: Neurology

## 2015-10-18 ENCOUNTER — Ambulatory Visit (INDEPENDENT_AMBULATORY_CARE_PROVIDER_SITE_OTHER): Payer: Medicare Other | Admitting: Neurology

## 2015-10-18 DIAGNOSIS — R269 Unspecified abnormalities of gait and mobility: Secondary | ICD-10-CM

## 2015-10-18 DIAGNOSIS — G609 Hereditary and idiopathic neuropathy, unspecified: Secondary | ICD-10-CM

## 2015-10-18 DIAGNOSIS — R55 Syncope and collapse: Secondary | ICD-10-CM | POA: Diagnosis not present

## 2015-10-18 DIAGNOSIS — I482 Chronic atrial fibrillation, unspecified: Secondary | ICD-10-CM

## 2015-10-18 NOTE — Procedures (Signed)
    History:  Miranda Erickson is a 80 year old patient with a history of multiple falling events. The patient has episodes of falling, she does not actually recall the fall, she seems to suddenly realize that she is on the floor. The patient is being evaluated for possible brief syncopal events.   This is a routine EEG. No skull defects are noted. Medications include albuterol, alprazolam, amitriptyline, Tenormin, Lipitor, Lasix, hydrocodone, insulin, Synthroid, metformin, Robaxin, multivitamins, Prilosec, Zofran, Paxil, MiraLAX, and potassium.   EEG classification: Normal awake  Description of the recording: The background rhythms of this recording consists of a fairly well modulated medium amplitude alpha rhythm of 8 Hz that is reactive to eye opening and closure. As the record progresses, the patient appears to remain in the waking state throughout the recording. Photic stimulation was performed, resulting in a bilateral and symmetric photic driving response. Hyperventilation was not performed. At no time during the recording does there appear to be evidence of spike or spike wave discharges or evidence of focal slowing. EKG monitor shows no evidence of cardiac rhythm abnormalities with a heart rate of 102.  Impression: This is a normal EEG recording in the waking state. No evidence of ictal or interictal discharges are seen.

## 2015-10-18 NOTE — Progress Notes (Signed)
Please refer to EMG and nerve conduction study procedure note. 

## 2015-10-18 NOTE — Telephone Encounter (Signed)
EEG done today was unremarkable, no evidence of a lowered seizure threshold.

## 2015-10-18 NOTE — Procedures (Signed)
     HISTORY:  Miranda Erickson is a 80 year old patient with a history of multiple episodes of falling. The patient indicates that she does not recall the actual fall many times, but she will suddenly find herself on the floor. The patient is using a walker for ambulation. She returns for an evaluation of the gait disorder.  NERVE CONDUCTION STUDIES:  Nerve conduction studies were performed on both lower extremities. The distal motor latencies for the peroneal and posterior tibial nerves were within normal limits bilaterally, with low motor amplitudes for these nerves bilaterally. The nerve conduction velocities for the peroneal and posterior tibial nerves were normal bilaterally. The H reflex latencies were unobtainable bilaterally, and the peroneal sensory latencies were unobtainable bilaterally.  EMG STUDIES:  EMG study was performed on the left lower extremity:  The tibialis anterior muscle reveals 2 to 4K motor units with full recruitment. No fibrillations or positive waves were seen. The peroneus tertius muscle reveals 2 to 4K motor units with full recruitment. No fibrillations or positive waves were seen. The medial gastrocnemius muscle reveals 1 to 3K motor units with full recruitment. No fibrillations or positive waves were seen. The vastus lateralis muscle reveals 2 to 4K motor units with full recruitment. No fibrillations or positive waves were seen. The iliopsoas muscle reveals 2 to 4K motor units with full recruitment. No fibrillations or positive waves were seen. The biceps femoris muscle (long head) reveals 2 to 4K motor units with full recruitment. No fibrillations or positive waves were seen. The lumbosacral paraspinal muscles were tested at 3 levels, and revealed no abnormalities of insertional activity at all 3 levels tested. There was good relaxation.   IMPRESSION:  Nerve conduction studies done on both lower extremities show evidence that is consistent with a primarily  axonal peripheral neuropathy. EMG evaluation however, revealed relatively unremarkable findings of the left lower extremity suggesting that the peripheral neuropathy is not as significant as nerve conduction studies indicate. The patient may have a mild peripheral neuropathy. Changes seen by nerve conduction study may be related to significant peripheral edema in the lower extremities.  Jill Alexanders MD 10/18/2015 11:37 AM  [facility] [address] [place], [place] [address]  Phone [phone] Fax [phone]

## 2015-10-22 LAB — MULTIPLE MYELOMA PANEL, SERUM
ALBUMIN SERPL ELPH-MCNC: 3.3 g/dL (ref 2.9–4.4)
ALPHA 1: 0.2 g/dL (ref 0.0–0.4)
Albumin/Glob SerPl: 1.1 (ref 0.7–1.7)
Alpha2 Glob SerPl Elph-Mcnc: 0.9 g/dL (ref 0.4–1.0)
B-Globulin SerPl Elph-Mcnc: 1.3 g/dL (ref 0.7–1.3)
Gamma Glob SerPl Elph-Mcnc: 1 g/dL (ref 0.4–1.8)
Globulin, Total: 3.3 g/dL (ref 2.2–3.9)
IGG (IMMUNOGLOBIN G), SERUM: 971 mg/dL (ref 700–1600)
IgA/Immunoglobulin A, Serum: 274 mg/dL (ref 64–422)
IgM (Immunoglobulin M), Srm: 43 mg/dL (ref 26–217)
TOTAL PROTEIN: 6.6 g/dL (ref 6.0–8.5)

## 2015-10-22 LAB — RHEUMATOID FACTOR: Rhuematoid fact SerPl-aCnc: <10 IU/mL (ref 0.0–13.9)

## 2015-10-22 LAB — ANA W/REFLEX: Anti Nuclear Antibody(ANA): NEGATIVE

## 2015-10-22 LAB — ANGIOTENSIN CONVERTING ENZYME: Angio Convert Enzyme: 51 U/L (ref 14–82)

## 2015-11-08 ENCOUNTER — Telehealth: Payer: Self-pay | Admitting: Neurology

## 2015-11-08 NOTE — Telephone Encounter (Signed)
I called the patient. The cardiac monitoring study is unremarkable.   Sinus rhythm Rare PACs No atrial fibrillation observed No sustained arrhythmias

## 2016-01-09 ENCOUNTER — Encounter: Payer: Self-pay | Admitting: Neurology

## 2016-01-09 ENCOUNTER — Ambulatory Visit (INDEPENDENT_AMBULATORY_CARE_PROVIDER_SITE_OTHER): Payer: Medicare Other | Admitting: Neurology

## 2016-01-09 VITALS — BP 158/92 | HR 92 | Resp 20 | Ht 61.0 in | Wt 208.0 lb

## 2016-01-09 DIAGNOSIS — R269 Unspecified abnormalities of gait and mobility: Secondary | ICD-10-CM

## 2016-01-09 NOTE — Patient Instructions (Signed)
Fall Prevention in the Home  Falls can cause injuries and can affect people from all age groups. There are many simple things that you can do to make your home safe and to help prevent falls. WHAT CAN I DO ON THE OUTSIDE OF MY HOME?  Regularly repair the edges of walkways and driveways and fix any cracks.  Remove high doorway thresholds.  Trim any shrubbery on the main path into your home.  Use bright outdoor lighting.  Clear walkways of debris and clutter, including tools and rocks.  Regularly check that handrails are securely fastened and in good repair. Both sides of any steps should have handrails.  Install guardrails along the edges of any raised decks or porches.  Have leaves, snow, and ice cleared regularly.  Use sand or salt on walkways during winter months.  In the garage, clean up any spills right away, including grease or oil spills. WHAT CAN I DO IN THE BATHROOM?  Use night lights.  Install grab bars by the toilet and in the tub and shower. Do not use towel bars as grab bars.  Use non-skid mats or decals on the floor of the tub or shower.  If you need to sit down while you are in the shower, use a plastic, non-slip stool..  Keep the floor dry. Immediately clean up any water that spills on the floor.  Remove soap buildup in the tub or shower on a regular basis.  Attach bath mats securely with double-sided non-slip rug tape.  Remove throw rugs and other tripping hazards from the floor. WHAT CAN I DO IN THE BEDROOM?  Use night lights.  Make sure that a bedside light is easy to reach.  Do not use oversized bedding that drapes onto the floor.  Have a firm chair that has side arms to use for getting dressed.  Remove throw rugs and other tripping hazards from the floor. WHAT CAN I DO IN THE KITCHEN?   Clean up any spills right away.  Avoid walking on wet floors.  Place frequently used items in easy-to-reach places.  If you need to reach for something  above you, use a sturdy step stool that has a grab bar.  Keep electrical cables out of the way.  Do not use floor polish or wax that makes floors slippery. If you have to use wax, make sure that it is non-skid floor wax.  Remove throw rugs and other tripping hazards from the floor. WHAT CAN I DO IN THE STAIRWAYS?  Do not leave any items on the stairs.  Make sure that there are handrails on both sides of the stairs. Fix handrails that are broken or loose. Make sure that handrails are as long as the stairways.  Check any carpeting to make sure that it is firmly attached to the stairs. Fix any carpet that is loose or worn.  Avoid having throw rugs at the top or bottom of stairways, or secure the rugs with carpet tape to prevent them from moving.  Make sure that you have a light switch at the top of the stairs and the bottom of the stairs. If you do not have them, have them installed. WHAT ARE SOME OTHER FALL PREVENTION TIPS?  Wear closed-toe shoes that fit well and support your feet. Wear shoes that have rubber soles or low heels.  When you use a stepladder, make sure that it is completely opened and that the sides are firmly locked. Have someone hold the ladder while you   are using it. Do not climb a closed stepladder.  Add color or contrast paint or tape to grab bars and handrails in your home. Place contrasting color strips on the first and last steps.  Use mobility aids as needed, such as canes, walkers, scooters, and crutches.  Turn on lights if it is dark. Replace any light bulbs that burn out.  Set up furniture so that there are clear paths. Keep the furniture in the same spot.  Fix any uneven floor surfaces.  Choose a carpet design that does not hide the edge of steps of a stairway.  Be aware of any and all pets.  Review your medicines with your healthcare provider. Some medicines can cause dizziness or changes in blood pressure, which increase your risk of falling. Talk  with your health care provider about other ways that you can decrease your risk of falls. This may include working with a physical therapist or trainer to improve your strength, balance, and endurance.   This information is not intended to replace advice given to you by your health care provider. Make sure you discuss any questions you have with your health care provider.   Document Released: 10/10/2002 Document Revised: 03/06/2015 Document Reviewed: 11/24/2014 Elsevier Interactive Patient Education 2016 Elsevier Inc.  

## 2016-01-09 NOTE — Progress Notes (Signed)
Reason for visit: Gait instability  Miranda Erickson is an 81 y.o. female  History of present illness:  Miranda Erickson is a 81 year old right-handed white female with a history of a mild peripheral neuropathy, but the patient has a significant gait disorder. The patient has had falls in the past that possibly were associated with an episode of loss of consciousness. The patient does not always remember the fall itself. Last fall occurred in early November 2016. The patient has not had any falls while using her walker. The patient has been very careful since that time, she has not had any further falling events. She has quit smoking. The patient has significant shortness of breath with minimal activity. The breathing issues are a limiting factor in her ability to perform physical activity. The patient has undergone an evaluation that included an EEG study, cardiac monitor study for 30 days, and she has undergone EMG nerve conduction study evaluation. The EEG was unremarkable, the cardiac monitor study did not show any significant cardiac rhythm abnormalities that would cause loss of consciousness. The nerve conduction study did show a mild peripheral neuropathy. She returns for an evaluation. She has had some issues with low back pain at times without radiation down the legs.  Past Medical History  Diagnosis Date  . Diverticulosis 11/2003    Seen on colonoscopy  . Diabetes mellitus   . Hypertension   . COPD (chronic obstructive pulmonary disease) ([place])   . 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 Mitral valve prolapse   . Hypothyroidism   . Complication of anesthesia     demerol, vomiting  . CHF (congestive heart failure) ([facility])   . Anxiety   . Hypercholesteremia   . Cancer (HCC)     breast  . Granuloma annulare   . Abnormality of gait 06/01/2015    Past Surgical History  Procedure Laterality Date  . Ulnar nerve repair      Decompression  of ulnar and median nerve at carpal tunnel   . Mastectomy      bilateral, s/p reconstruction  . Appendectomy    . Cholecystectomy    . Tonsillectomy    . Colonoscopy  11/14/2011    Procedure: COLONOSCOPY;  Surgeon: Winfield Cunas., MD;  [facility] END[facility]COPY;  Service: Endoscopy;  Laterality: N/A;  . Flexible sigmoidoscopy N/A 06/27/2014    Procedure: FLEXIBLE SIGMOIDOSCOPY;  Surgeon: Winfield Cunas., MD;  Location: [facility] ENDOSCOPY;  Service: Endoscopy;  Laterality: N/A;    Family History  Problem Relation Age of Onset  . Breast cancer Mother     Deceased at 39  . Parkinsonism Father     Social history:  reports that she has quit smoking. Her smoking use included Cigarettes. She has a 7.5 pack-year smoking history. She quit smokeless tobacco use about 2 months ago. She reports that she does not drink alcohol or use illicit drugs.    Allergies  Allergen Reactions  . Demerol Nausea And Vomiting  . Sulfa Antibiotics Hives    Medications:  Prior to Admission medications   Medication Sig Start Date End Date Taking? Authorizing Provider  albuterol (PROVENTIL HFA;VENTOLIN HFA) 108 (90 BASE) MCG/ACT inhaler Inhale 2 puffs into the lungs every 6 (six) hours as needed. Shortness of breath   Yes Historical Provider, MD  ALPRAZolam (XANAX) 0.5 MG tablet Take 0.5 mg by mouth 2 (two) times daily as needed for  anxiety.    Yes Historical Provider, MD  amitriptyline (ELAVIL) 100 MG tablet Take 100 mg by mouth at bedtime.   Yes Historical Provider, MD  atenolol (TENORMIN) 100 MG tablet Take 100 mg by mouth every morning.    Yes Historical Provider, MD  atorvastatin (LIPITOR) 20 MG tablet Take 20 mg by mouth daily.   Yes Historical Provider, MD  Calcium Carbonate-Vitamin D (CALCIUM 600 + D PO) Take 1 tablet by mouth 2 (two) times daily.   Yes Historical Provider, MD  furosemide (LASIX) 20 MG tablet Take 2 tablets once daily for 5 days and then take one tablet once daily. 06/29/14  Yes Bonnielee Haff, MD  HYDROcodone-acetaminophen (NORCO/VICODIN) 5-325 MG tablet Take 1-2 tablets by mouth every 4 (four) hours as needed. 09/11/15  Yes Lacretia Leigh, MD  insulin NPH-regular (HUMULIN 70/30) (70-30) 100 UNIT/ML injection Inject 30-50 Units into the skin 2 (two) times daily. 50 units in the morning, and 30 units in the evening   Yes Historical Provider, MD  levothyroxine (SYNTHROID, LEVOTHROID) 50 MCG tablet Take 50 mcg by mouth daily.   Yes Historical Provider, MD  metFORMIN (GLUCOPHAGE) 1000 MG tablet Take 1,000 mg by mouth 2 (two) times daily with a meal.   Yes Historical Provider, MD  methocarbamol (ROBAXIN) 500 MG tablet Take 1 tablet (500 mg total) by mouth 2 (two) times daily. 09/11/15  Yes Lacretia Leigh, MD  Multiple Vitamin (MULTIVITAMIN WITH MINERALS) TABS tablet Take 1 tablet by mouth daily.   Yes Historical Provider, MD  nystatin cream (MYCOSTATIN) Apply 1 application topically 2 (two) times daily as needed for dry skin.  08/26/15  Yes Historical Provider, MD  omeprazole (PRILOSEC) 20 MG capsule Take 20 mg by mouth daily.  07/20/15  Yes Historical Provider, MD  ondansetron (ZOFRAN) 4 MG tablet Take 1 tablet (4 mg total) by mouth every 6 (six) hours as needed for nausea. 07/19/13  Yes Velvet Bathe, MD  PARoxetine (PAXIL) 40 MG tablet Take 1 tablet by mouth daily. 07/20/15  Yes Historical Provider, MD  polyethylene glycol (MIRALAX / GLYCOLAX) packet Take 17 g by mouth daily. Patient taking differently: Take 17 g by mouth daily as needed for mild constipation.  06/29/14  Yes Bonnielee Haff, MD  potassium chloride (K-DUR) 10 MEQ tablet Take 1 tablet (10 mEq total) by mouth daily. 06/29/14  Yes Bonnielee Haff, MD  triamcinolone cream (KENALOG) 0.1 %  12/21/15  Yes Historical Provider, MD    ROS:  Out of a complete 14 system review of symptoms, the patient complains only of the following symptoms, and all other reviewed systems are negative.  Back pain Gait disturbance  Blood pressure  158/92, pulse 92, resp. rate 20, height 5\' 1"  (1.549 m), weight 208 lb (94.348 kg).  Physical Exam  General: The patient is alert and cooperative at the time of the examination. The patient is moderately to markedly obese.  Skin: There is 3+ edema below the knees bilaterally.   Neurologic Exam  Mental status: The patient is alert and oriented x 3 at the time of the examination. The patient has apparent normal recent and remote memory, with an apparently normal attention span and concentration ability.   Cranial nerves: Facial symmetry is present. Speech is normal, no aphasia or dysarthria is noted. Extraocular movements are full. Visual fields are full.  Motor: The patient has good strength in all 4 extremities.  Sensory examination: Soft touch sensation is symmetric on the face, arms, and legs.  Coordination:  The patient has good finger-nose-finger and heel-to-shin bilaterally.  Gait and station: The patient has a sliding wide-based, unsteady gait. The patient uses a walker for ambulation. Romberg is unsteady, the patient does not fall. Tandem gait is unsteady.  Reflexes: Deep tendon reflexes are symmetric, but are depressed.   Assessment/Plan:  1. Gait disturbance, falls  2. Peripheral neuropathy  The patient was undergoing physical therapy in November, she did not complete the course of physical therapy because she fell and hurt her low back. We will restart the physical therapy for balance training. She will follow-up in 6 months. If the episodes of falls continue, we may consider MRI of the brain with MRA of the head and a carotid Doppler study.  Jill Alexanders MD 01/09/2016 8:25 PM  [facility] [address] [place], [place] [address]  Phone [phone] Fax [phone]

## 2016-02-12 ENCOUNTER — Encounter (HOSPITAL_COMMUNITY): Payer: Self-pay | Admitting: Emergency Medicine

## 2016-02-12 ENCOUNTER — Emergency Department (HOSPITAL_COMMUNITY)
Admission: EM | Admit: 2016-02-12 | Discharge: 2016-02-12 | Disposition: A | Discharge status: Home / Self Care | Payer: Medicare Other | Attending: Emergency Medicine | Admitting: Emergency Medicine

## 2016-02-12 ENCOUNTER — Emergency Department (HOSPITAL_COMMUNITY): Payer: Medicare Other

## 2016-02-12 DIAGNOSIS — Z87891 Personal history of nicotine dependence: Secondary | ICD-10-CM | POA: Diagnosis not present

## 2016-02-12 DIAGNOSIS — Z7984 Long term (current) use of oral hypoglycemic drugs: Secondary | ICD-10-CM | POA: Insufficient documentation

## 2016-02-12 DIAGNOSIS — Z853 Personal history of malignant neoplasm of breast: Secondary | ICD-10-CM | POA: Diagnosis not present

## 2016-02-12 DIAGNOSIS — E7800 Pure hypercholesterolemia, unspecified: Secondary | ICD-10-CM | POA: Insufficient documentation

## 2016-02-12 DIAGNOSIS — E119 Type 2 diabetes mellitus without complications: Secondary | ICD-10-CM | POA: Diagnosis not present

## 2016-02-12 DIAGNOSIS — K219 Gastro-esophageal reflux disease without esophagitis: Secondary | ICD-10-CM | POA: Diagnosis not present

## 2016-02-12 DIAGNOSIS — I341 Nonrheumatic mitral (valve) prolapse: Secondary | ICD-10-CM | POA: Diagnosis not present

## 2016-02-12 DIAGNOSIS — R1011 Right upper quadrant pain: Secondary | ICD-10-CM | POA: Diagnosis present

## 2016-02-12 DIAGNOSIS — Z872 Personal history of diseases of the skin and subcutaneous tissue: Secondary | ICD-10-CM | POA: Diagnosis not present

## 2016-02-12 DIAGNOSIS — J441 Chronic obstructive pulmonary disease with (acute) exacerbation: Secondary | ICD-10-CM | POA: Diagnosis not present

## 2016-02-12 DIAGNOSIS — Z79899 Other long term (current) drug therapy: Secondary | ICD-10-CM | POA: Diagnosis not present

## 2016-02-12 DIAGNOSIS — Z794 Long term (current) use of insulin: Secondary | ICD-10-CM | POA: Diagnosis not present

## 2016-02-12 DIAGNOSIS — I509 Heart failure, unspecified: Secondary | ICD-10-CM | POA: Insufficient documentation

## 2016-02-12 DIAGNOSIS — Z8669 Personal history of other diseases of the nervous system and sense organs: Secondary | ICD-10-CM | POA: Insufficient documentation

## 2016-02-12 DIAGNOSIS — I10 Essential (primary) hypertension: Secondary | ICD-10-CM | POA: Insufficient documentation

## 2016-02-12 DIAGNOSIS — F419 Anxiety disorder, unspecified: Secondary | ICD-10-CM | POA: Diagnosis not present

## 2016-02-12 LAB — CBC
HEMATOCRIT: 36.7 % (ref 36.0–46.0)
HEMOGLOBIN: 11.9 g/dL — AB (ref 12.0–15.0)
MCH: 29.9 pg (ref 26.0–34.0)
MCHC: 32.4 g/dL (ref 30.0–36.0)
MCV: 92.2 fL (ref 78.0–100.0)
Platelets: 237 10*3/uL (ref 150–400)
RBC: 3.98 MIL/uL (ref 3.87–5.11)
RDW: 13.1 % (ref 11.5–15.5)
WBC: 4.9 10*3/uL (ref 4.0–10.5)

## 2016-02-12 LAB — COMPREHENSIVE METABOLIC PANEL
ALBUMIN: 4 g/dL (ref 3.5–5.0)
ALT: 38 U/L (ref 14–54)
ANION GAP: 12 (ref 5–15)
AST: 27 U/L (ref 15–41)
Alkaline Phosphatase: 68 U/L (ref 38–126)
BILIRUBIN TOTAL: 0.7 mg/dL (ref 0.3–1.2)
BUN: 16 mg/dL (ref 6–20)
CO2: 28 mmol/L (ref 22–32)
Calcium: 9.4 mg/dL (ref 8.9–10.3)
Chloride: 98 mmol/L — ABNORMAL LOW (ref 101–111)
Creatinine, Ser: 1.02 mg/dL — ABNORMAL HIGH (ref 0.44–1.00)
GFR calc Af Amer: 59 mL/min — ABNORMAL LOW (ref >60)
GFR calc non Af Amer: 51 mL/min — ABNORMAL LOW (ref >60)
GLUCOSE: 198 mg/dL — AB (ref 65–99)
POTASSIUM: 4 mmol/L (ref 3.5–5.1)
Sodium: 138 mmol/L (ref 135–145)
TOTAL PROTEIN: 7.5 g/dL (ref 6.5–8.1)

## 2016-02-12 LAB — URINALYSIS, ROUTINE W REFLEX MICROSCOPIC
Bilirubin Urine: NEGATIVE
Glucose, UA: NEGATIVE mg/dL
Ketones, ur: NEGATIVE mg/dL
NITRITE: NEGATIVE
PH: 5.5 (ref 5.0–8.0)
Protein, ur: NEGATIVE mg/dL
SPECIFIC GRAVITY, URINE: 1.015 (ref 1.005–1.030)

## 2016-02-12 LAB — LIPASE, BLOOD: LIPASE: 25 U/L (ref 11–51)

## 2016-02-12 LAB — URINE MICROSCOPIC-ADD ON: Bacteria, UA: NONE SEEN

## 2016-02-12 LAB — I-STAT TROPONIN, ED: TROPONIN I, POC: 0.01 ng/mL (ref 0.00–0.08)

## 2016-02-12 MED ORDER — HYDROCODONE-ACETAMINOPHEN 5-325 MG PO TABS
1.0000 | ORAL_TABLET | Freq: Once | ORAL | Status: DC
Start: 2016-02-12 — End: 2016-02-12

## 2016-02-12 NOTE — ED Notes (Signed)
Pt c/o RUQ and right flank pain x 2 days. Denies and N/V. Pt also c/o sob but states that's not new

## 2016-02-12 NOTE — Discharge Instructions (Signed)
Tonight you evaluated for discomfort in your upper abdomen, lower chest all test conducted were normal.  Chest x-ray was normal.  Cardiac markers are normal.  It is suggested that you double up on your Prilosec, which is a medication to take for you.  Gastric reflux for the next couple days and follow-up with your primary care physician.  Please try to eat a very bland diet.  Try not to eat within 2-3  hours of lying down

## 2016-02-12 NOTE — ED Provider Notes (Signed)
CSN: [id number]     Arrival date & time 02/12/16  1221 History   First MD Initiated Contact with Patient 02/12/16 2027     Chief Complaint  Patient presents with  . Abdominal Pain     (Consider location/radiation/quality/duration/timing/severity/associated sxs/prior Treatment) HPI Comments: This a 81 year old female who states for the past 2 days.  She's had bilateral upper quadrant pain radiating to under the shoulder blades.  It's been persistent and constant.  She's had some shortness of breath associated with this but no nausea or vomiting.  She states she's been having regular bowel movements.  She states she could not get comfortable in any position, and last night.  She wound up sitting in a chair and dosing.  She went to her primary care doctor this evening and was sent to the emergency department after a physical examination, but no lab work or x-ray.  Patient states she knows it's pancreatitis, although I have no documentation of previous history of pancreatitis. She states that she hasn't had a waiting to eat or drink today, she was feeling extremely thirsty, so she tried a couple T with a teaspoon of sugar and within 30 minutes she was experiencing increase in her pain.  Patient is a 81 y.o. female presenting with abdominal pain. The history is provided by the patient.  Abdominal Pain Pain location:  LUQ and RUQ Pain quality: aching and pressure   Pain quality: not bloating   Pain radiates to:  L shoulder and R shoulder Pain severity:  Moderate Onset quality:  Gradual Duration:  2 days Timing:  Constant Progression:  Unchanged Chronicity:  New Context: not alcohol use, not diet changes and not eating   Relieved by:  Nothing Worsened by:  Eating Ineffective treatments:  None tried Associated symptoms: no anorexia, no chest pain, no constipation, no diarrhea, no dysuria, no fever, no nausea and no shortness of breath     Past Medical History  Diagnosis Date  .  Diverticulosis 11/2003    Seen on colonoscopy  . Diabetes mellitus   . Hypertension   . COPD (chronic obstructive pulmonary disease) ([place])   . 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 Mitral valve prolapse   . Hypothyroidism   . Complication of anesthesia     demerol, vomiting  . CHF (congestive heart failure) ([place])   . Anxiety   . Hypercholesteremia   . Cancer (HCC)     breast  . Granuloma annulare   . Abnormality of gait 06/01/2015   Past Surgical History  Procedure Laterality Date  . Ulnar nerve repair      Decompression of ulnar and median nerve at carpal tunnel   . Mastectomy      bilateral, s/p reconstruction  . Appendectomy    . Cholecystectomy    . Tonsillectomy    . Colonoscopy  11/14/2011    Procedure: COLONOSCOPY;  Surgeon: Winfield Cunas., MD;  Location: [facility] ENDOSCOPY;  Service: Endoscopy;  Laterality: N/A;  . Flexible sigmoidoscopy N/A 06/27/2014    Procedure: FLEXIBLE SIGMOIDOSCOPY;  Surgeon: Winfield Cunas., MD;  Location: [facility] ENDOSCOPY;  Service: Endoscopy;  Laterality: N/A;   Family History  Problem Relation Age of Onset  . Breast cancer Mother     Deceased at 76  . Parkinsonism Father    Social History  Substance Use Topics  . Smoking status: Former Smoker -- 0.50 packs/day for 15 years  Types: Cigarettes  . Smokeless tobacco: Former Systems developer    Quit date: 10/23/2015  . Alcohol Use: No   OB History    No data available     Review of Systems  Constitutional: Negative for fever.  Respiratory: Negative for shortness of breath.   Cardiovascular: Positive for leg swelling. Negative for chest pain.  Gastrointestinal: Positive for abdominal pain. Negative for nausea, diarrhea, constipation and anorexia.  Genitourinary: Negative for dysuria and frequency.  All other systems reviewed and are negative.     Allergies  Demerol and Sulfa antibiotics  Home Medications   Prior to Admission medications    Medication Sig Start Date End Date Taking? Authorizing Provider  albuterol (PROVENTIL HFA;VENTOLIN HFA) 108 (90 BASE) MCG/ACT inhaler Inhale 2 puffs into the lungs every 6 (six) hours as needed. Shortness of breath   Yes Historical Provider, MD  ALPRAZolam (XANAX) 0.5 MG tablet Take 0.5 mg by mouth 2 (two) times daily as needed for anxiety.    Yes Historical Provider, MD  amitriptyline (ELAVIL) 100 MG tablet Take 100 mg by mouth at bedtime.   Yes Historical Provider, MD  atenolol (TENORMIN) 100 MG tablet Take 100 mg by mouth every morning.    Yes Historical Provider, MD  atorvastatin (LIPITOR) 20 MG tablet Take 20 mg by mouth daily.   Yes Historical Provider, MD  Calcium Carbonate-Vitamin D (CALCIUM 600 + D PO) Take 1 tablet by mouth 2 (two) times daily.   Yes Historical Provider, MD  furosemide (LASIX) 20 MG tablet Take 2 tablets once daily for 5 days and then take one tablet once daily. Patient taking differently: Take 20 mg by mouth daily. Take 2 tablets once daily for 5 days and then take one tablet once daily. 06/29/14  Yes Bonnielee Haff, MD  insulin glargine (LANTUS) 100 UNIT/ML injection Inject 40 Units into the skin every evening.    Yes Historical Provider, MD  levothyroxine (SYNTHROID, LEVOTHROID) 50 MCG tablet Take 50 mcg by mouth daily.   Yes Historical Provider, MD  metFORMIN (GLUCOPHAGE) 1000 MG tablet Take 1,000 mg by mouth 2 (two) times daily with a meal.   Yes Historical Provider, MD  methocarbamol (ROBAXIN) 500 MG tablet Take 1 tablet (500 mg total) by mouth 2 (two) times daily. 09/11/15  Yes Lacretia Leigh, MD  Multiple Vitamin (MULTIVITAMIN WITH MINERALS) TABS tablet Take 1 tablet by mouth daily.   Yes Historical Provider, MD  nystatin cream (MYCOSTATIN) Apply 1 application topically 2 (two) times daily as needed for dry skin.  08/26/15  Yes Historical Provider, MD  omeprazole (PRILOSEC) 20 MG capsule Take 20 mg by mouth daily.  07/20/15  Yes Historical Provider, MD  PARoxetine  (PAXIL) 40 MG tablet Take 1 tablet by mouth daily. 07/20/15  Yes Historical Provider, MD  polyethylene glycol (MIRALAX / GLYCOLAX) packet Take 17 g by mouth daily. Patient taking differently: Take 17 g by mouth daily as needed for mild constipation.  06/29/14  Yes Bonnielee Haff, MD  potassium chloride (K-DUR) 10 MEQ tablet Take 1 tablet (10 mEq total) by mouth daily. 06/29/14  Yes Bonnielee Haff, MD  triamcinolone cream (KENALOG) 0.1 % Apply 1 application topically daily as needed. For Rash 12/21/15  Yes Historical Provider, MD  HYDROcodone-acetaminophen (NORCO/VICODIN) 5-325 MG tablet Take 1-2 tablets by mouth every 4 (four) hours as needed. Patient not taking: Reported on 02/12/2016 09/11/15   Lacretia Leigh, MD  ondansetron (ZOFRAN) 4 MG tablet Take 1 tablet (4 mg total) by mouth every 6 (  six) hours as needed for nausea. Patient not taking: Reported on 02/12/2016 07/19/13   Velvet Bathe, MD   BP 111/64 mmHg  Pulse 90  Temp(Src) 98 F (36.7 C) (Oral)  Resp 19  Ht 5\' 2"  (1.575 m)  Wt 92.987 kg  BMI 37.49 kg/m2  SpO2 95% Physical Exam  Constitutional: She appears well-developed and well-nourished.  HENT:  Head: Normocephalic.  Eyes: Pupils are equal, round, and reactive to light.  Neck: Normal range of motion.  Cardiovascular: Normal rate and regular rhythm.   Pulmonary/Chest: Effort normal and breath sounds normal. No respiratory distress. She has no wheezes. She has no rales. She exhibits no tenderness.  Abdominal: Soft. She exhibits no distension. There is tenderness.    Musculoskeletal: She exhibits edema. She exhibits no tenderness.  She has peripheral edema over lateral 70.  She states this is normal.  She did not take any of her medication today so twice.  He worse than normal.  She also sat with her legs dependent all night  Neurological: She is alert.  Skin: Skin is warm.  Nursing note and vitals reviewed.   ED Course  Procedures (including critical care time) Labs  Review Labs Reviewed  COMPREHENSIVE METABOLIC PANEL - Abnormal; Notable for the following:    Chloride 98 (*)    Glucose, Bld 198 (*)    Creatinine, Ser 1.02 (*)    GFR calc non Af Amer 51 (*)    GFR calc Af Amer 59 (*)    All other components within normal limits  CBC - Abnormal; Notable for the following:    Hemoglobin 11.9 (*)    All other components within normal limits  URINALYSIS, ROUTINE W REFLEX MICROSCOPIC (NOT AT [facility]) - Abnormal; Notable for the following:    Hgb urine dipstick SMALL (*)    Leukocytes, UA SMALL (*)    All other components within normal limits  URINE MICROSCOPIC-ADD ON - Abnormal; Notable for the following:    Squamous Epithelial / LPF 0-5 (*)    All other components within normal limits  LIPASE, BLOOD  I-STAT TROPOININ, ED    Imaging Review Dg Chest 2 View  02/12/2016  CLINICAL DATA:  Right upper quadrant and right flank pain for 2 days. EXAM: CHEST  2 VIEW COMPARISON:  None. FINDINGS: The heart size and mediastinal contours are within normal limits. Both lungs are clear. The visualized skeletal structures are unremarkable. IMPRESSION: No active cardiopulmonary disease. Electronically Signed   By: Andreas Newport M.D.   On: 02/12/2016 23:08   I have personally reviewed and evaluated these images and lab results as part of my medical decision-making.   EKG Interpretation None     on physical examination, patient points to bilateral lower rib area.  She is not particularly tender or uncomfortable with deep palpation in the right upper quadrant or left upper quadrant and no tenderness in the epigastric area at all Patient is no longer having any discomfort at all.  She's been able to ambulate without recurrence of her pain will be recommended that she double up on her Prilosec for a few days and follow-up with her primary care physician MDM   Final diagnoses:  Gastroesophageal reflux disease without esophagitis        Junius Creamer, NP 02/13/16  0159  Milton Ferguson, MD 02/14/16 1150

## 2016-02-12 NOTE — ED Notes (Signed)
Pt ambulated with walking cane.  Abd. Pain did not return

## 2016-07-15 ENCOUNTER — Ambulatory Visit: Payer: Medicare Other | Admitting: Neurology

## 2016-07-15 ENCOUNTER — Telehealth: Payer: Self-pay

## 2016-07-15 NOTE — Telephone Encounter (Signed)
Pt called the answer phone same day as appt to cancel.

## 2016-07-16 ENCOUNTER — Encounter: Payer: Self-pay | Admitting: Neurology

## 2017-04-15 IMAGING — CT CT HEAD W/O CM
4 series · 18 of 30 positions shown, 19 images · non-contrast
Comparison: MRI brain 04/15/2004.

CLINICAL DATA: Head injury. Fell 1 week ago getting out of bed.
Dizziness and confusion. Initial encounter.

EXAM:
CT HEAD WITHOUT CONTRAST
TECHNIQUE: Contiguous axial images were obtained from the base of the skull
through the vertex without intravenous contrast.

[Series 3: head bone · axial · 0.49mm/px · z∈[-0,+136]mm · 8 of 64 slices shown (1 of 2)]
[im 6/64  bone]
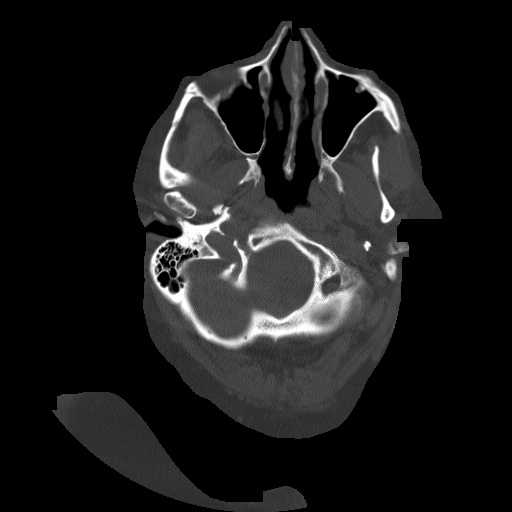
[im 12/64  bone]
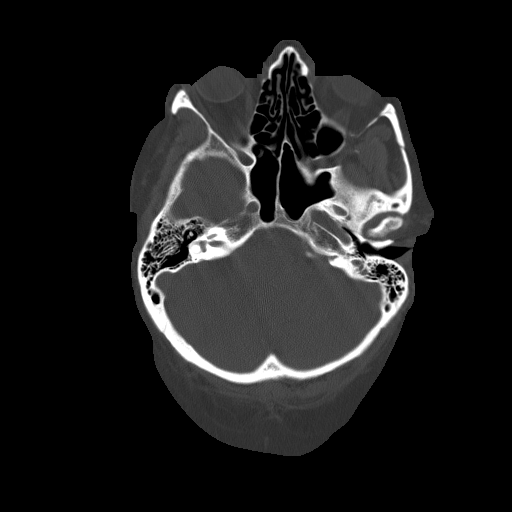
[im 23/64  bone]
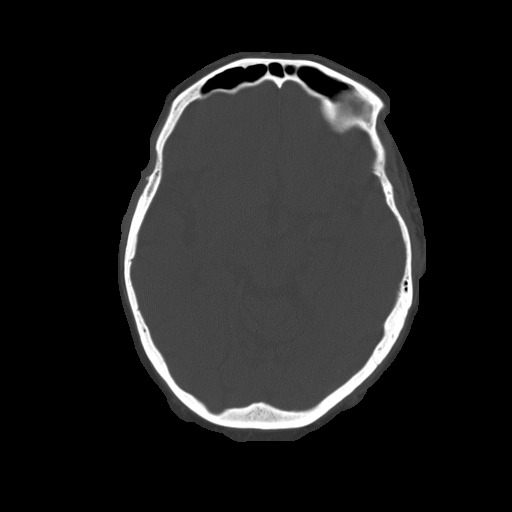
[im 29/64  bone]
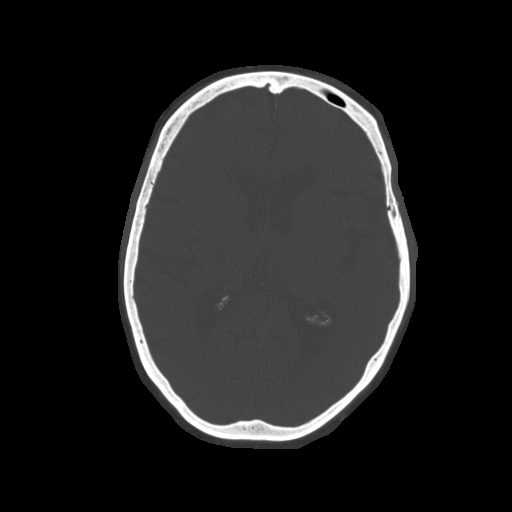
[im 35/64  bone]
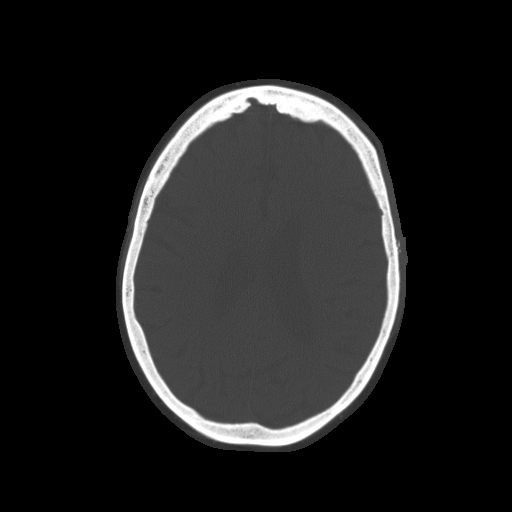
[im 41/64  bone]
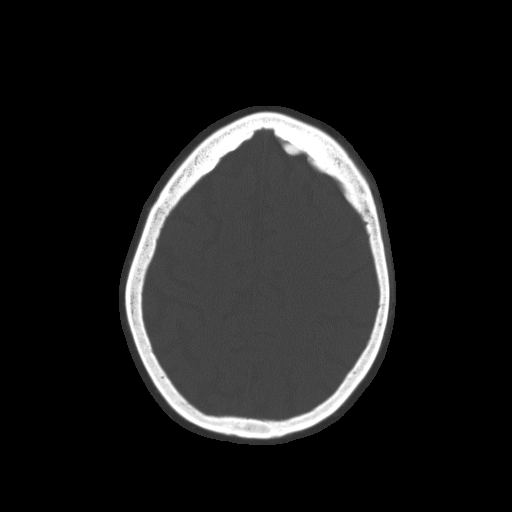
[im 52/64  bone]
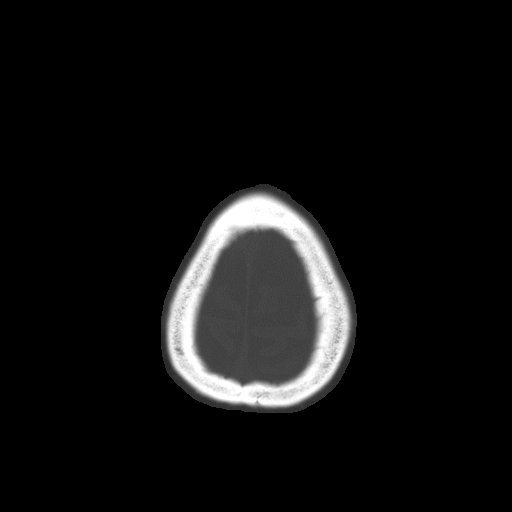
[im 58/64  bone]
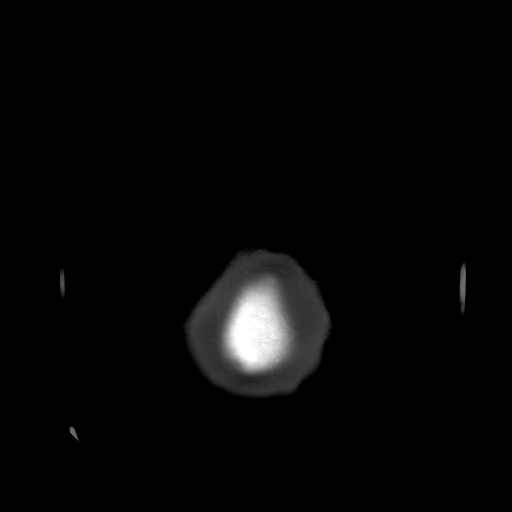

[Series 5: head bone · axial · 0.49mm/px · z∈[-38,+4]mm · 4 of 40 slices shown (2 of 2)]
[im 6/40  bone]
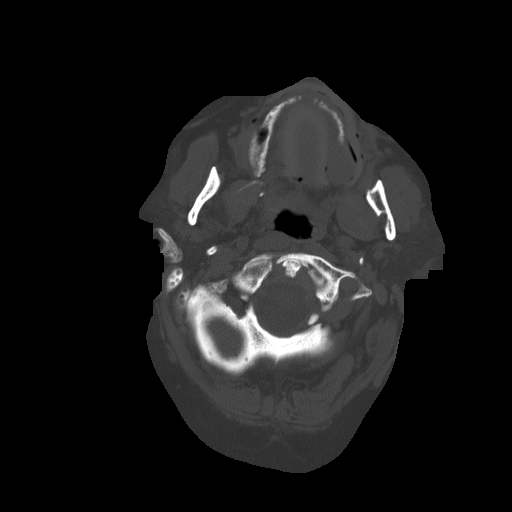
[im 12/40  bone]
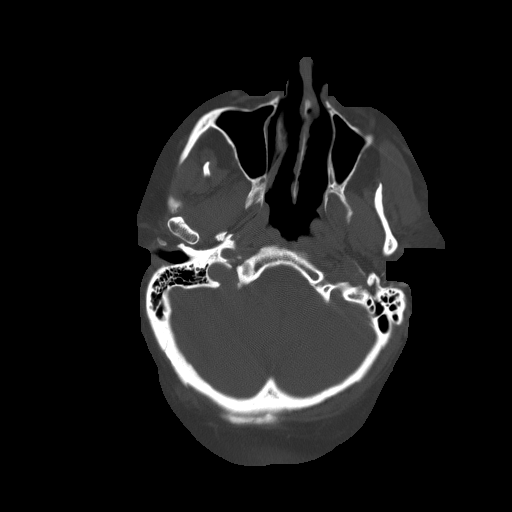
[im 17/40  bone]
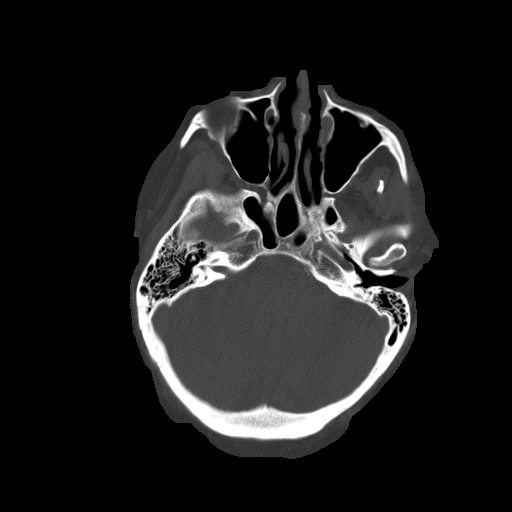
[im 23/40  bone]
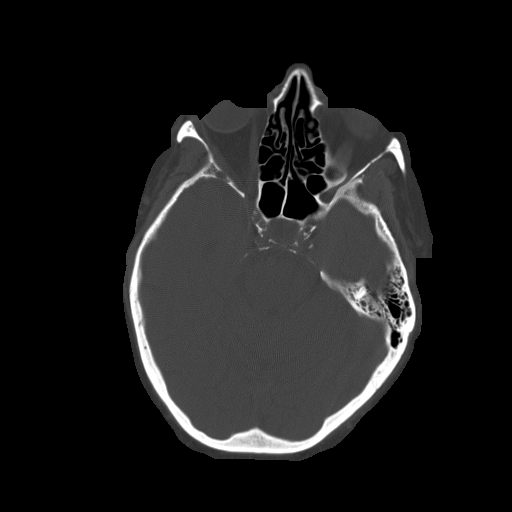

[Series 32: 3d filtered head w/o · axial · non-contrast · 0.49mm/px · z∈[+19,+114]mm · 4 of 32 slices shown, 5 images (1 of 2)]
[im 7/32  brain]
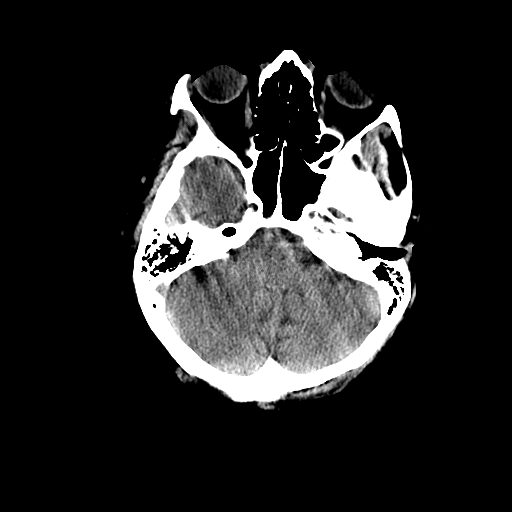
[im 7/32  bone]
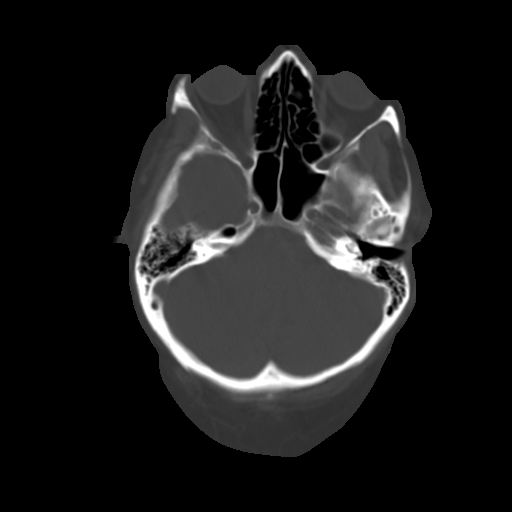
[im 13/32  brain]
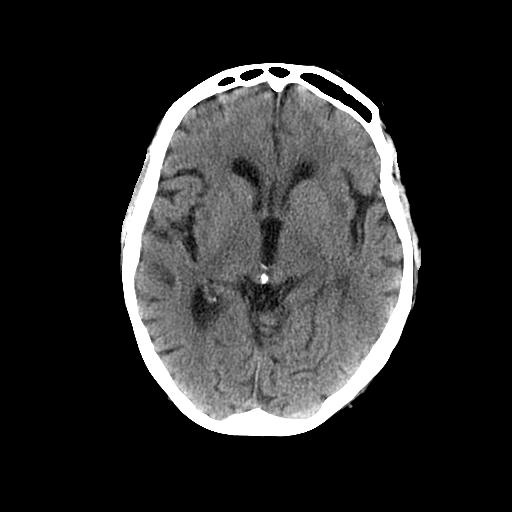
[im 19/32  brain]
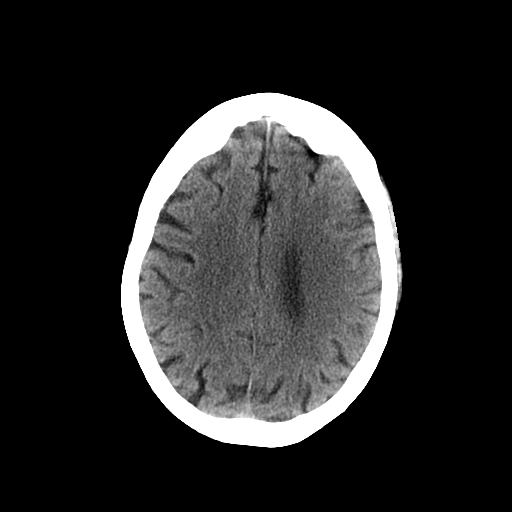
[im 25/32  brain]
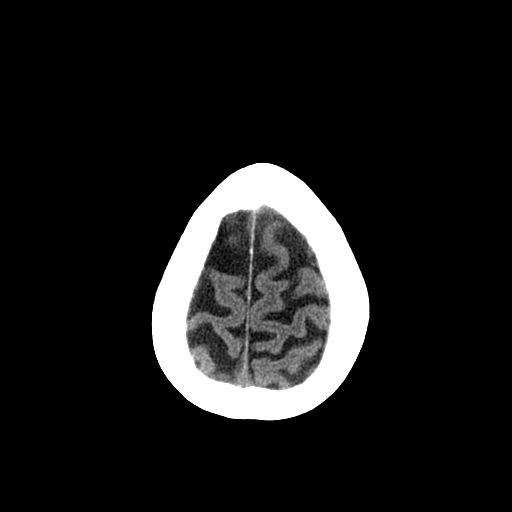

[Series 34: 3d filtered head w/o · axial · non-contrast · 0.49mm/px · z∈[-19,+11]mm · 2 of 20 slices shown (2 of 2)]
[im 7/20  brain]
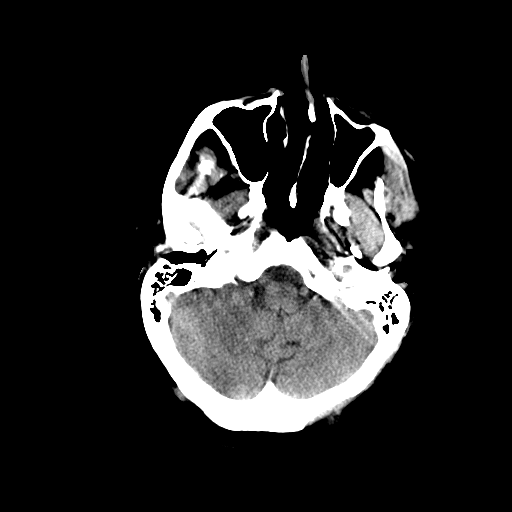
[im 13/20  brain]
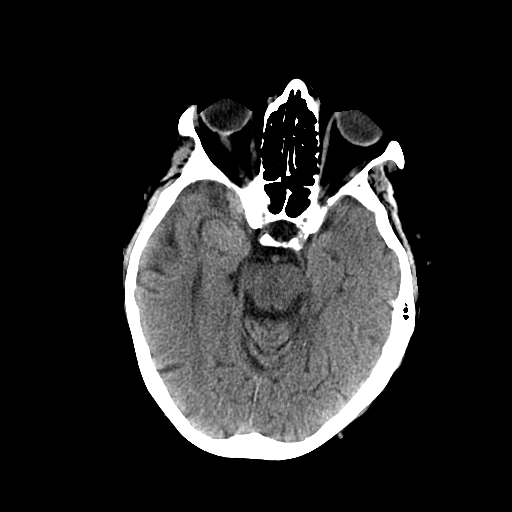

[18 of 30 positions shown; findings below may reference images not displayed]

FINDINGS: Soft tissue swelling is present of the left temporal and frontal
scalp without an underlying fracture. No acute

Mild atrophy and minimal white matter changes are within normal
limits for age. No acute cortical infarct, hemorrhage, or mass
lesion is present. The ventricles are proportionate to the degree of
atrophy.

The paranasal sinuses and mastoid air cells are clear.
Atherosclerotic calcifications are present in the cavernous internal
carotid arteries.
IMPRESSION: 1. Left frontal and temporal scalp soft tissue swelling without an
underlying fracture.
2. Normal CT appearance of the brain for age.
3. Atherosclerosis.

## 2017-04-15 IMAGING — CR DG CHEST 2V
2 series · 2 of 2 positions shown · non-contrast
Comparison: 06/29/2014

CLINICAL DATA: Shortness of breath for few days, hypertension COPD

EXAM:
CHEST  2 VIEW

[w chest pa]
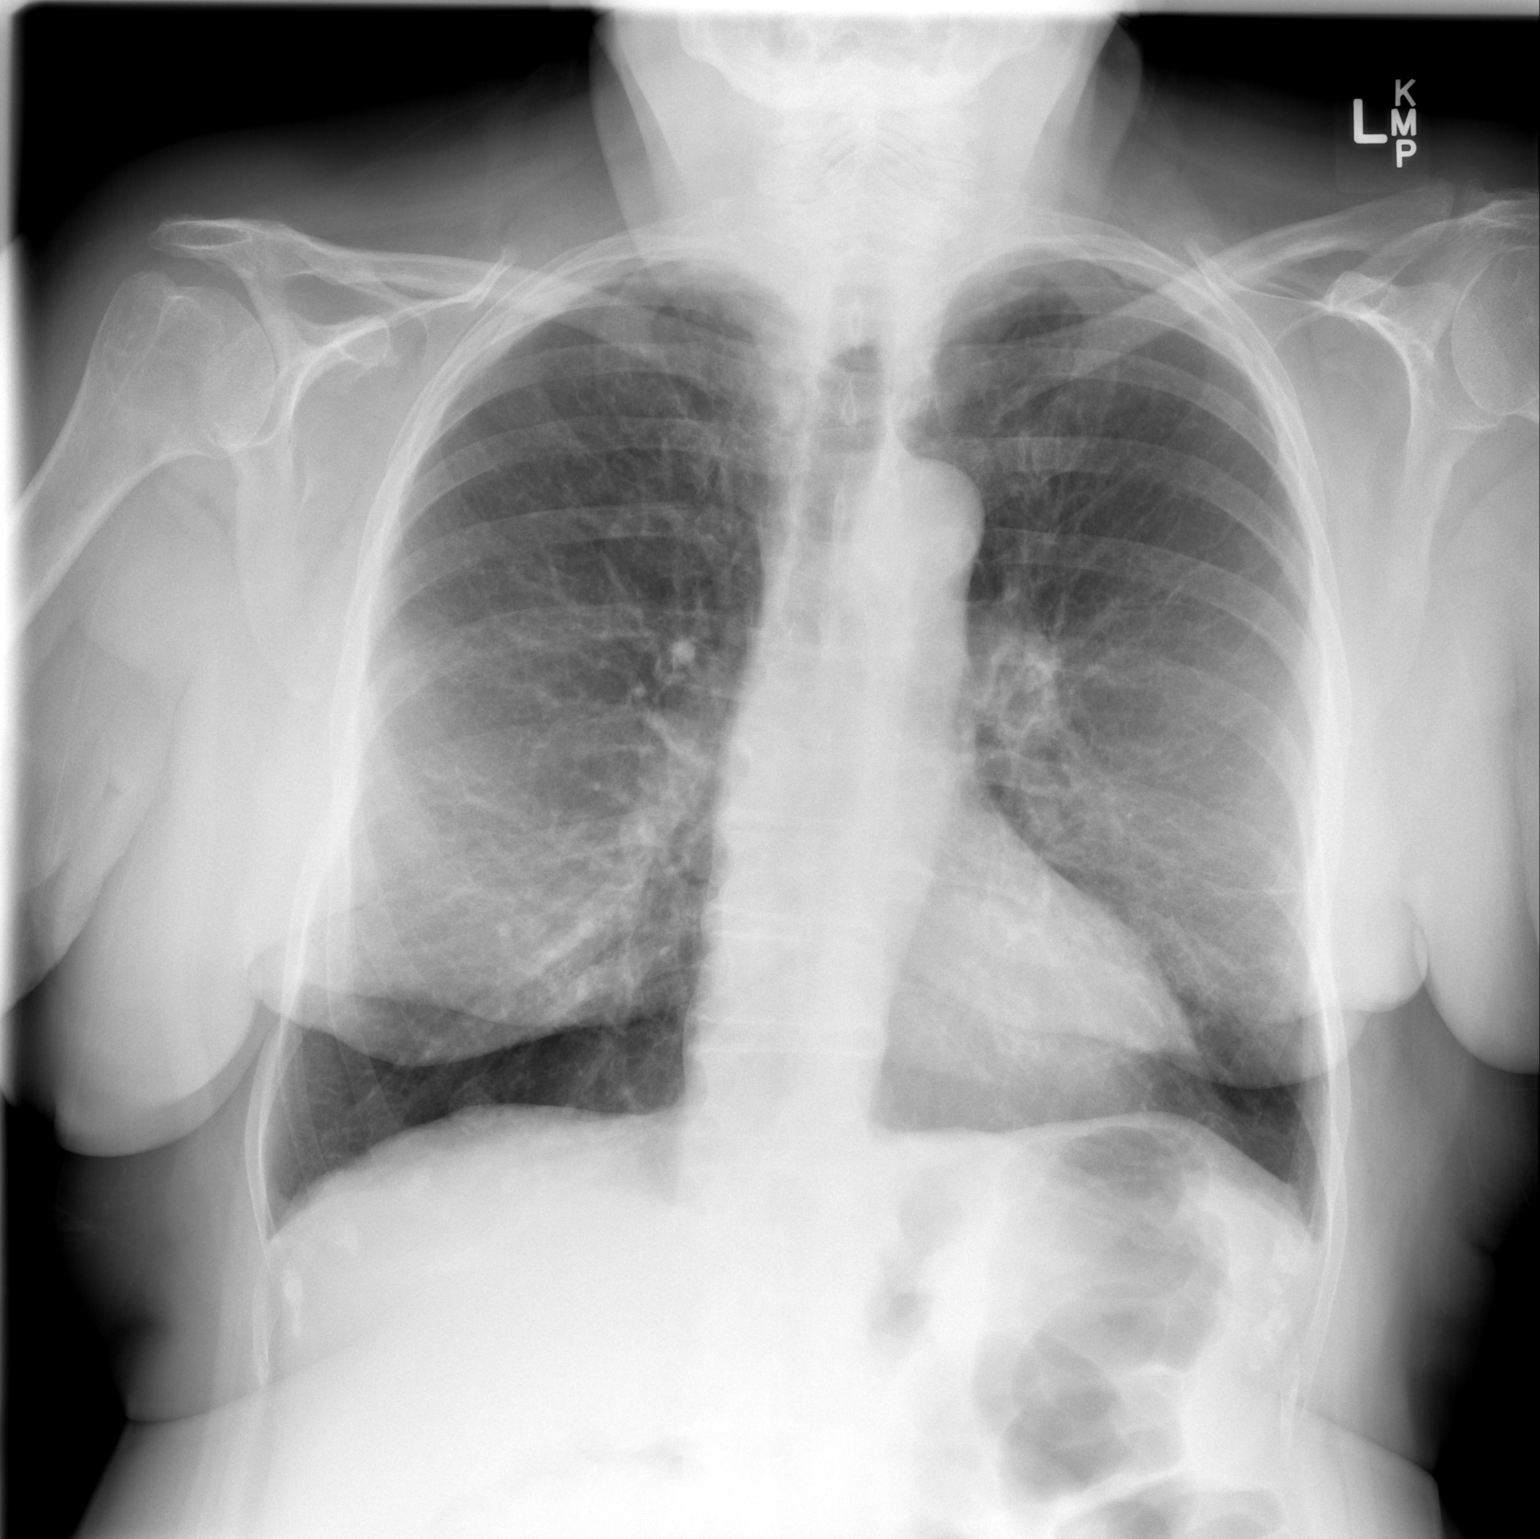

[w chest lat]
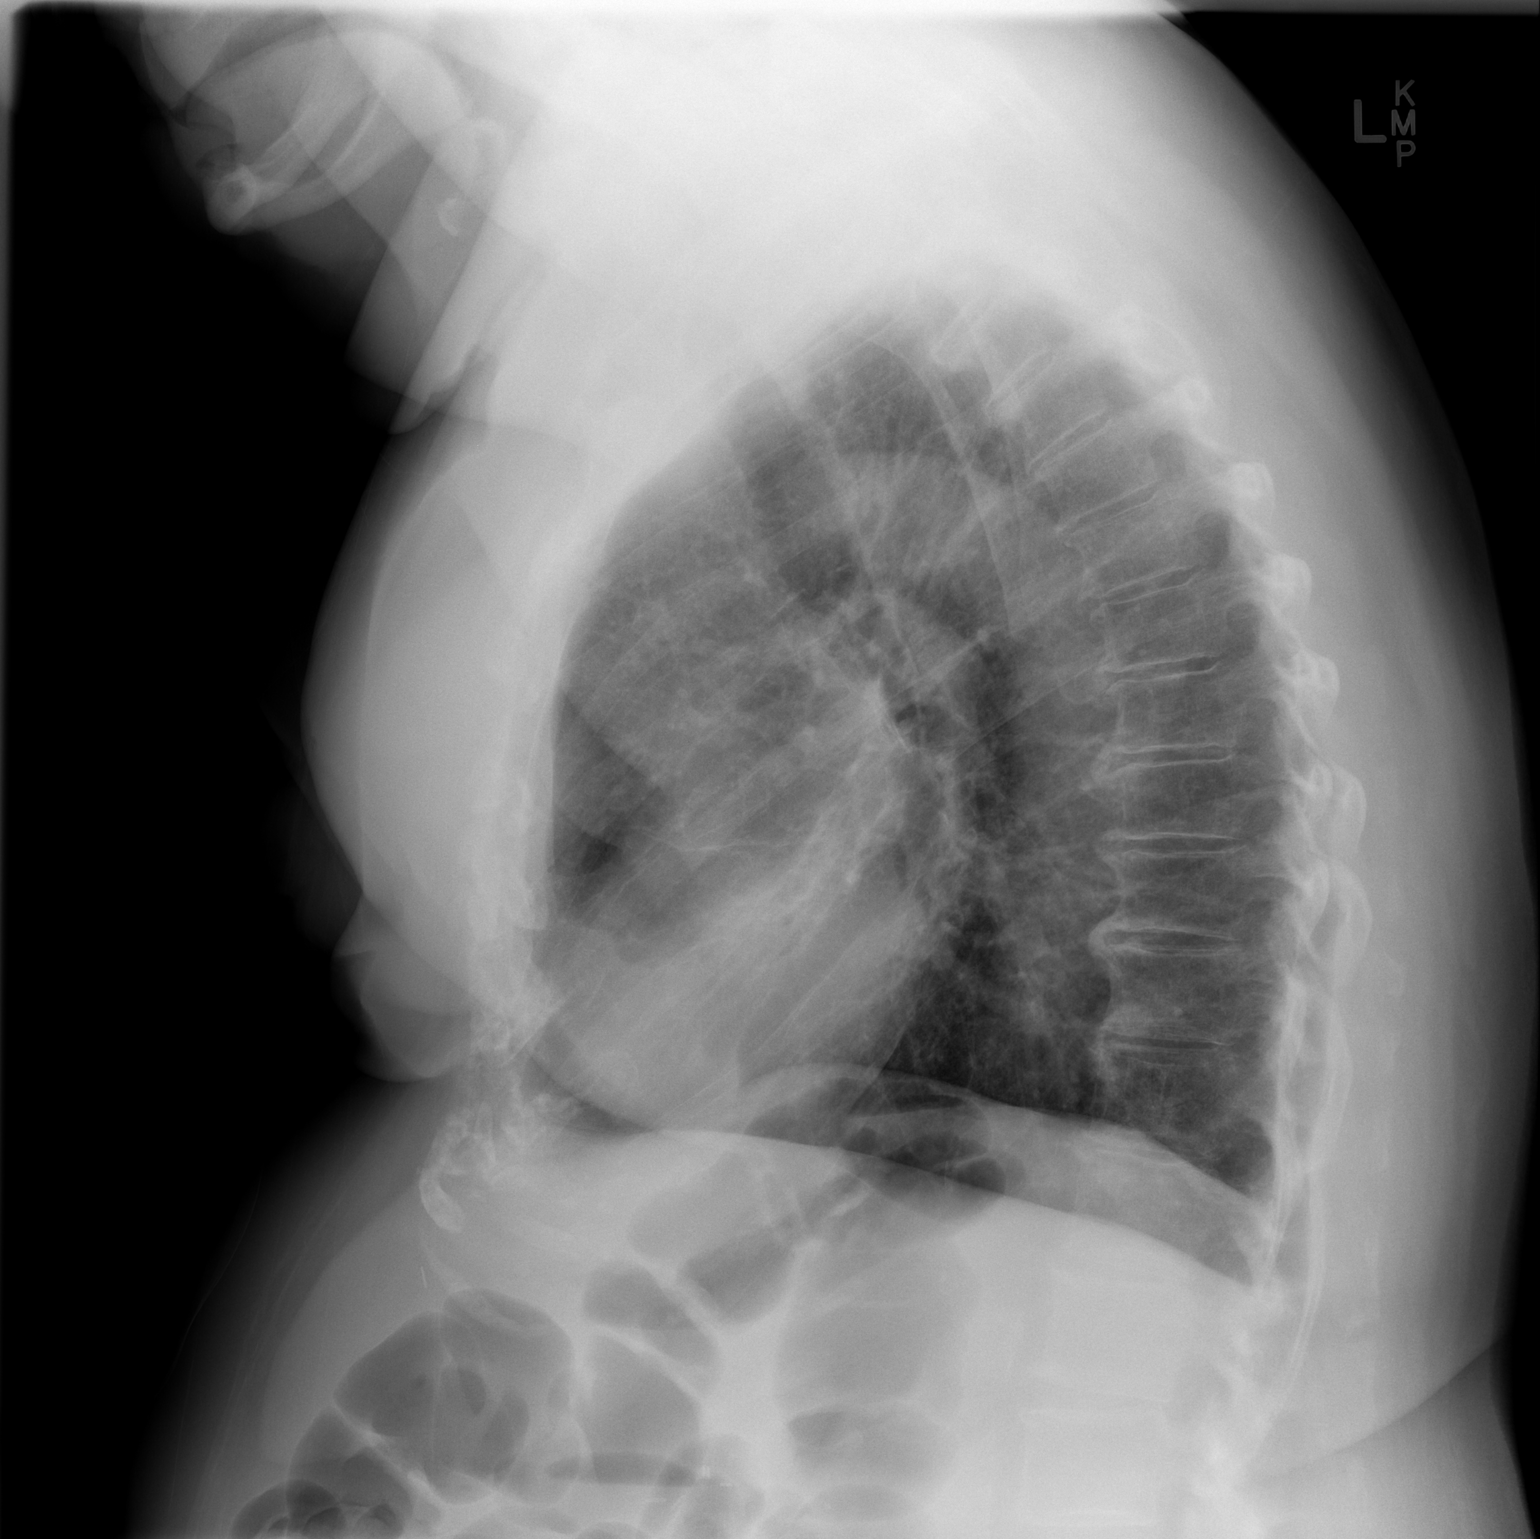

[2 of 2 positions shown; findings below may reference images not displayed]

FINDINGS: Hyperinflation consistent with COPD. Heart size and vascular pattern
normal. No consolidation or effusion. Minimal mid to lower lung zone
atelectasis and scarring.
IMPRESSION: COPD with no acute abnormalities

## 2024-12-04 DIAGNOSIS — I472 Ventricular tachycardia, unspecified: Secondary | ICD-10-CM

## 2024-12-04 DIAGNOSIS — I482 Chronic atrial fibrillation, unspecified: Secondary | ICD-10-CM

## 2024-12-04 DIAGNOSIS — I4729 Other ventricular tachycardia: Secondary | ICD-10-CM

## 2024-12-05 DIAGNOSIS — R9431 Abnormal electrocardiogram [ECG] [EKG]: Secondary | ICD-10-CM

## 2024-12-05 DIAGNOSIS — I493 Ventricular premature depolarization: Secondary | ICD-10-CM

## 2024-12-06 DIAGNOSIS — I361 Nonrheumatic tricuspid (valve) insufficiency: Secondary | ICD-10-CM

## 2024-12-06 DIAGNOSIS — R000 Tachycardia, unspecified: Secondary | ICD-10-CM
